# Patient Record
Sex: Female | Born: 1970 | Race: White | Hispanic: No | Smoking: Never smoker
Health system: Southern US, Community
[De-identification: ages and names within clinical notes are randomized; demographics above are authoritative.]

## PROBLEM LIST (undated history)

## (undated) DIAGNOSIS — R51 Headache: Secondary | ICD-10-CM

## (undated) DIAGNOSIS — E039 Hypothyroidism, unspecified: Secondary | ICD-10-CM

## (undated) DIAGNOSIS — R569 Unspecified convulsions: Secondary | ICD-10-CM

## (undated) HISTORY — PX: TUBAL LIGATION: SHX77

---

## 2000-02-23 ENCOUNTER — Other Ambulatory Visit: Admission: RE | Admit: 2000-02-23 | Discharge: 2000-02-23 | Payer: Self-pay | Admitting: Obstetrics and Gynecology

## 2001-03-16 ENCOUNTER — Other Ambulatory Visit: Admission: RE | Admit: 2001-03-16 | Discharge: 2001-03-16 | Payer: Self-pay | Admitting: Obstetrics and Gynecology

## 2002-04-01 ENCOUNTER — Inpatient Hospital Stay (HOSPITAL_COMMUNITY): Admission: AD | Admit: 2002-04-01 | Discharge: 2002-04-01 | Payer: Self-pay | Admitting: Obstetrics and Gynecology

## 2002-04-02 ENCOUNTER — Encounter (INDEPENDENT_AMBULATORY_CARE_PROVIDER_SITE_OTHER): Payer: Self-pay | Admitting: Specialist

## 2002-04-02 ENCOUNTER — Inpatient Hospital Stay (HOSPITAL_COMMUNITY): Admission: AD | Admit: 2002-04-02 | Discharge: 2002-04-05 | Payer: Self-pay | Admitting: *Deleted

## 2002-04-07 ENCOUNTER — Inpatient Hospital Stay (HOSPITAL_COMMUNITY): Admission: AD | Admit: 2002-04-07 | Discharge: 2002-04-07 | Payer: Self-pay | Admitting: Obstetrics and Gynecology

## 2002-05-02 ENCOUNTER — Other Ambulatory Visit: Admission: RE | Admit: 2002-05-02 | Discharge: 2002-05-02 | Payer: Self-pay | Admitting: Obstetrics and Gynecology

## 2003-03-31 ENCOUNTER — Encounter: Payer: Self-pay | Admitting: Emergency Medicine

## 2003-03-31 ENCOUNTER — Emergency Department (HOSPITAL_COMMUNITY): Admission: EM | Admit: 2003-03-31 | Discharge: 2003-03-31 | Payer: Self-pay | Admitting: *Deleted

## 2003-05-07 ENCOUNTER — Ambulatory Visit (HOSPITAL_COMMUNITY): Admission: RE | Admit: 2003-05-07 | Discharge: 2003-05-07 | Payer: Self-pay | Admitting: Neurology

## 2003-05-16 ENCOUNTER — Other Ambulatory Visit: Admission: RE | Admit: 2003-05-16 | Discharge: 2003-05-16 | Payer: Self-pay | Admitting: *Deleted

## 2004-11-08 ENCOUNTER — Ambulatory Visit (HOSPITAL_COMMUNITY): Admission: RE | Admit: 2004-11-08 | Discharge: 2004-11-08 | Payer: Self-pay | Admitting: Obstetrics and Gynecology

## 2005-07-04 ENCOUNTER — Inpatient Hospital Stay (HOSPITAL_COMMUNITY): Admission: AD | Admit: 2005-07-04 | Discharge: 2005-07-04 | Payer: Self-pay | Admitting: *Deleted

## 2005-07-05 ENCOUNTER — Inpatient Hospital Stay (HOSPITAL_COMMUNITY): Admission: AD | Admit: 2005-07-05 | Discharge: 2005-07-05 | Payer: Self-pay | Admitting: Obstetrics and Gynecology

## 2005-07-26 ENCOUNTER — Inpatient Hospital Stay (HOSPITAL_COMMUNITY): Admission: AD | Admit: 2005-07-26 | Discharge: 2005-07-29 | Payer: Self-pay | Admitting: Obstetrics & Gynecology

## 2005-07-26 ENCOUNTER — Ambulatory Visit: Payer: Self-pay | Admitting: Neonatology

## 2005-09-08 ENCOUNTER — Inpatient Hospital Stay (HOSPITAL_COMMUNITY): Admission: RE | Admit: 2005-09-08 | Discharge: 2005-09-08 | Payer: Self-pay | Admitting: *Deleted

## 2005-09-08 ENCOUNTER — Inpatient Hospital Stay (HOSPITAL_COMMUNITY): Admission: RE | Admit: 2005-09-08 | Discharge: 2005-09-10 | Payer: Self-pay | Admitting: Obstetrics and Gynecology

## 2006-01-16 ENCOUNTER — Encounter: Admission: RE | Admit: 2006-01-16 | Discharge: 2006-01-16 | Payer: Self-pay | Admitting: Orthopaedic Surgery

## 2006-07-05 ENCOUNTER — Emergency Department (HOSPITAL_COMMUNITY): Admission: EM | Admit: 2006-07-05 | Discharge: 2006-07-05 | Payer: Self-pay | Admitting: *Deleted

## 2006-07-08 ENCOUNTER — Emergency Department (HOSPITAL_COMMUNITY): Admission: EM | Admit: 2006-07-08 | Discharge: 2006-07-08 | Payer: Self-pay | Admitting: Emergency Medicine

## 2006-08-23 ENCOUNTER — Ambulatory Visit: Payer: Self-pay | Admitting: Internal Medicine

## 2006-09-05 ENCOUNTER — Ambulatory Visit: Payer: Self-pay | Admitting: Internal Medicine

## 2006-09-13 ENCOUNTER — Ambulatory Visit: Payer: Self-pay | Admitting: Internal Medicine

## 2006-09-13 LAB — CONVERTED CEMR LAB: Cortisol, Plasma: 10.4 ug/dL

## 2006-09-28 ENCOUNTER — Ambulatory Visit: Payer: Self-pay | Admitting: Internal Medicine

## 2007-01-01 ENCOUNTER — Ambulatory Visit: Payer: Self-pay | Admitting: Internal Medicine

## 2007-02-21 ENCOUNTER — Ambulatory Visit: Payer: Self-pay | Admitting: Internal Medicine

## 2007-03-14 ENCOUNTER — Ambulatory Visit: Payer: Self-pay | Admitting: Licensed Clinical Social Worker

## 2007-03-21 ENCOUNTER — Ambulatory Visit: Payer: Self-pay | Admitting: Licensed Clinical Social Worker

## 2007-03-30 ENCOUNTER — Ambulatory Visit: Payer: Self-pay | Admitting: Licensed Clinical Social Worker

## 2007-04-18 ENCOUNTER — Ambulatory Visit: Payer: Self-pay | Admitting: Licensed Clinical Social Worker

## 2007-05-02 ENCOUNTER — Ambulatory Visit: Payer: Self-pay | Admitting: Licensed Clinical Social Worker

## 2007-05-11 ENCOUNTER — Ambulatory Visit: Payer: Self-pay | Admitting: Licensed Clinical Social Worker

## 2007-05-23 ENCOUNTER — Ambulatory Visit: Payer: Self-pay | Admitting: Licensed Clinical Social Worker

## 2007-05-25 ENCOUNTER — Ambulatory Visit: Payer: Self-pay | Admitting: Internal Medicine

## 2007-05-30 ENCOUNTER — Ambulatory Visit: Payer: Self-pay | Admitting: Licensed Clinical Social Worker

## 2007-08-28 DIAGNOSIS — R51 Headache: Secondary | ICD-10-CM | POA: Insufficient documentation

## 2007-08-28 DIAGNOSIS — R519 Headache, unspecified: Secondary | ICD-10-CM | POA: Insufficient documentation

## 2007-09-04 ENCOUNTER — Ambulatory Visit: Payer: Self-pay | Admitting: Internal Medicine

## 2007-09-04 DIAGNOSIS — F4321 Adjustment disorder with depressed mood: Secondary | ICD-10-CM | POA: Insufficient documentation

## 2007-12-27 ENCOUNTER — Ambulatory Visit: Payer: Self-pay | Admitting: Internal Medicine

## 2007-12-27 DIAGNOSIS — Z8669 Personal history of other diseases of the nervous system and sense organs: Secondary | ICD-10-CM | POA: Insufficient documentation

## 2007-12-27 DIAGNOSIS — J3089 Other allergic rhinitis: Secondary | ICD-10-CM | POA: Insufficient documentation

## 2008-01-21 ENCOUNTER — Telehealth: Payer: Self-pay | Admitting: Internal Medicine

## 2008-03-03 ENCOUNTER — Encounter: Admission: RE | Admit: 2008-03-03 | Discharge: 2008-03-03 | Payer: Self-pay | Admitting: Otolaryngology

## 2008-04-16 ENCOUNTER — Inpatient Hospital Stay (HOSPITAL_COMMUNITY): Admission: AD | Admit: 2008-04-16 | Discharge: 2008-04-17 | Payer: Self-pay | Admitting: Obstetrics and Gynecology

## 2008-04-16 ENCOUNTER — Inpatient Hospital Stay (HOSPITAL_COMMUNITY): Admission: AD | Admit: 2008-04-16 | Discharge: 2008-04-16 | Payer: Self-pay | Admitting: *Deleted

## 2008-04-17 ENCOUNTER — Inpatient Hospital Stay (HOSPITAL_COMMUNITY): Admission: AD | Admit: 2008-04-17 | Discharge: 2008-04-17 | Payer: Self-pay | Admitting: Obstetrics and Gynecology

## 2008-06-04 ENCOUNTER — Inpatient Hospital Stay (HOSPITAL_COMMUNITY): Admission: RE | Admit: 2008-06-04 | Discharge: 2008-06-07 | Payer: Self-pay | Admitting: *Deleted

## 2009-06-10 ENCOUNTER — Telehealth: Payer: Self-pay | Admitting: Internal Medicine

## 2009-06-10 DIAGNOSIS — M542 Cervicalgia: Secondary | ICD-10-CM | POA: Insufficient documentation

## 2009-06-11 ENCOUNTER — Ambulatory Visit: Payer: Self-pay | Admitting: Family Medicine

## 2009-06-11 DIAGNOSIS — G43909 Migraine, unspecified, not intractable, without status migrainosus: Secondary | ICD-10-CM | POA: Insufficient documentation

## 2009-06-15 ENCOUNTER — Telehealth: Payer: Self-pay | Admitting: Internal Medicine

## 2009-06-15 ENCOUNTER — Encounter: Admission: RE | Admit: 2009-06-15 | Discharge: 2009-06-15 | Payer: Self-pay | Admitting: Internal Medicine

## 2009-06-17 ENCOUNTER — Telehealth: Payer: Self-pay | Admitting: Internal Medicine

## 2009-06-19 ENCOUNTER — Telehealth: Payer: Self-pay | Admitting: Internal Medicine

## 2009-08-19 ENCOUNTER — Telehealth: Payer: Self-pay | Admitting: Internal Medicine

## 2009-12-01 DIAGNOSIS — G4700 Insomnia, unspecified: Secondary | ICD-10-CM | POA: Insufficient documentation

## 2009-12-02 ENCOUNTER — Encounter: Payer: Self-pay | Admitting: Internal Medicine

## 2009-12-10 ENCOUNTER — Telehealth: Payer: Self-pay | Admitting: Internal Medicine

## 2009-12-10 DIAGNOSIS — E039 Hypothyroidism, unspecified: Secondary | ICD-10-CM | POA: Insufficient documentation

## 2010-01-25 ENCOUNTER — Telehealth: Payer: Self-pay | Admitting: Internal Medicine

## 2010-03-22 ENCOUNTER — Ambulatory Visit: Payer: Self-pay | Admitting: Family Medicine

## 2010-03-22 DIAGNOSIS — K5289 Other specified noninfective gastroenteritis and colitis: Secondary | ICD-10-CM | POA: Insufficient documentation

## 2010-09-06 ENCOUNTER — Encounter: Payer: Self-pay | Admitting: Internal Medicine

## 2010-12-28 NOTE — Assessment & Plan Note (Signed)
Summary: ? food poisoning/vomitting/diarrhea/cjr   Vital Signs:  Patient profile:   40 year old female Menstrual status:  regular Weight:      114 pounds Temp:     99.0 degrees F oral BP sitting:   88 / 60  (left arm) Cuff size:   regular  Vitals Entered By: Raechel Ache, RN (March 22, 2010 3:14 PM) CC: On cruise last week- c/o vomiting Thurs & Fri, diarrhea over weekend. Still feels weak, no appetite.   History of Present Illness: Here for 6 days of mild abdominal cramps, diarrhea, and nausea. She vomitted once. No fever. This started when she got back from a [place] cruise with her family last week. her daughter has similar symptoms, husband and son are okay. using Imodium and eating bland foods.   Allergies: 1)  ! Erythromycin 2)  ! Augmentin  Past History:  Past Medical History: Reviewed history from 08/28/2007 and no changes required. Allergies Seizure disorder Migraines Scarlet Fever Headache  Review of Systems  The patient denies anorexia, fever, weight loss, weight gain, vision loss, decreased hearing, hoarseness, chest pain, syncope, dyspnea on exertion, peripheral edema, prolonged cough, headaches, hemoptysis, melena, hematochezia, severe indigestion/heartburn, hematuria, incontinence, genital sores, muscle weakness, suspicious skin lesions, transient blindness, difficulty walking, depression, unusual weight change, abnormal bleeding, enlarged lymph nodes, angioedema, breast masses, and testicular masses.    Physical Exam  General:  Well-developed,well-nourished,in no acute distress; alert,appropriate and cooperative throughout examination Lungs:  Normal respiratory effort, chest expands symmetrically. Lungs are clear to auscultation, no crackles or wheezes. Heart:  Normal rate and regular rhythm. S1 and S2 normal without gallop, murmur, click, rub or other extra sounds. Abdomen:  Bowel sounds positive,abdomen soft and non-tender without masses, organomegaly  or hernias noted.   Impression & Recommendations:  Problem # 1:  GASTROENTERITIS (ICD-558.9)  Complete Medication List: 1)  Midrin 325-65-100 Mg Caps (Apap-isometheptene-dichloral) .... Two by mouth q 6 hours as needed ha 2)  Centrum Chew (Multiple vitamins-minerals) .... Two qd 3)  Calcium 500/d 500-400 Mg-unit Chew (Calcium-vitamin d) .... Two once daily 4)  Ra Glucosamine-chondroit-msm 500-400-300 Mg Tabs (Glucosamine-chondroitin-msm) .... Once daily 5)  Topamax 200 Mg Tabs (Topiramate) .Marland Kitchen.. 1 once daily 6)  Prednisone 10 Mg Tabs (Prednisone) .... Taper as follows:  4-4-4-3-3-2-2-1 7)  Hydrocodone-acetaminophen 5-325 Mg Tabs (Hydrocodone-acetaminophen) .... One to two by mouth q 4-6 hours as needed 8)  Lunesta 3 Mg Tabs (Eszopiclone) .Marland Kitchen.. 1 at bedtime as needed sleep 9)  Esgic-plus 50-500-40 Mg Tabs (Butalbital-apap-caffeine) .... One by mouth q 6 hours as needed headache 10)  Levothyroxine Sodium 25 Mcg Tabs (Levothyroxine sodium) .Marland Kitchen.. 1 once daily 11)  Dhea 10 Mg Caps (Prasterone (dhea)) .Marland Kitchen.. 1 once daily 12)  Cipro 500 Mg Tabs (Ciprofloxacin hcl) .... Two times a day  Patient Instructions: 1)  Try Cipro.  2)  Please schedule a follow-up appointment as needed .  Prescriptions: CIPRO 500 MG TABS (CIPROFLOXACIN HCL) two times a day  #14 x 0   Entered and Authorized by:   Nelwyn Salisbury MD   Signed by:   Nelwyn Salisbury MD on 03/22/2010   Method used:   Electronically to        [facility]  [phone]* (retail)       [address]       [place], [place]  [address]       Ph: [phone] or [phone]  Fax: [phone]   RxID:   [id number]

## 2010-12-28 NOTE — Letter (Signed)
[facility]   Imported By: Maryln Gottron 09/14/2010 10:33:00  _____________________________________________________________________  External Attachment:    Type:   Image     Comment:   External Document

## 2010-12-28 NOTE — Progress Notes (Signed)
Summary: Endocrinology referral  Phone Note Call from Patient   Caller: Patient Call For: Stacie Glaze MD Reason for Call: Acute Illness Summary of Call: [phone] Pt has been seeing an OB of hormones and thyroid issues and wants a referral from Dr. Lovell Sheehan to a female Endocrinologist if he knows one in the area.  Initial call taken by: Lynann Beaver CMA,  December 10, 2009 1:28 PM  Follow-up for Phone Call        there is dr balan, if she is currently taking any new pt. we would have to make a referral.  Would she like [place] to that? Follow-up by: Willy Eddy, LPN,  December 11, 2009 8:36 AM  New Problems: HYPOTHYROIDISM (ICD-244.9)   New Problems: HYPOTHYROIDISM (ICD-244.9)

## 2010-12-28 NOTE — Medication Information (Signed)
Summary: Approval for Hyponotics/[employer]  Approval for Hyponotics/[employer]   Imported By: Maryln Gottron 12/07/2009 14:35:00  _____________________________________________________________________  External Attachment:    Type:   Image     Comment:   External Document

## 2010-12-28 NOTE — Progress Notes (Signed)
Summary: No Midrin .........in pain.  Phone Note Call from Patient   Caller: Patient Call For: Stacie Glaze MD Summary of Call: Laurie Oneal ([place]) No Midrin has been available for weeks and weeks, and is having a migraine.  Needs substitute for pain, ASAP! [phone] Initial call taken by: Lynann Beaver CMA,  January 25, 2010 8:54 AM  Follow-up for Phone Call        can send in esgic plus and need OV with 30 days  ( number 30 one by mouth q 6 hours Follow-up by: Stacie Glaze MD,  January 25, 2010 8:56 AM    New/Updated Medications: ESGIC-PLUS 50-500-40 MG TABS (BUTALBITAL-APAP-CAFFEINE) one by mouth q 6 hours as needed headache Prescriptions: ESGIC-PLUS 50-500-40 MG TABS (BUTALBITAL-APAP-CAFFEINE) one by mouth q 6 hours as needed headache  #30 x 0   Entered by:   Lynann Beaver CMA   Authorized by:   Stacie Glaze MD   Signed by:   Lynann Beaver CMA on 01/25/2010   Method used:   Electronically to        [facility]  [phone]* (retail)       [address]       [place], [place]  [address]       Ph: [phone] or [phone]       Fax: [phone]   RxID:   [id number]  Pt. notified.

## 2011-04-12 NOTE — Op Note (Signed)
Laurie Oneal, BEAVERS NO.:  [id number]   MEDICAL RECORD NO.:  [id number]          PATIENT TYPE:  INP   LOCATION:  9104                          [facility]ILITY[facility]   PHYSICIAN:  Gerri Spore B. Earlene Plater, M.D.  DATE OF BIRTH:  03-30-1971   DATE OF PROCEDURE:  06/04/2008  DATE OF DISCHARGE:                               OPERATIVE REPORT   PREOPERATIVE DIAGNOSIS:  Previous C-section x2 and desires tubal  sterilization.   POSTOPERATIVE DIAGNOSIS:  Previous C-section x2 and desires tubal  sterilization.   PROCEDURES:  Repeat C-section and bilateral tubal ligation.   SURGEON:  Chester Holstein. Earlene Plater, MD   ASSISTANT:  Maxie Better, MD   ANESTHESIA:  Spinal.   FINDINGS:  Viable female of 7 pounds 10 ounces, Apgars 8 and 9, normal-  appearing uterus, tubes, and ovaries.  Moderate adherence of the bladder  to the lower uterine segment.   INDICATIONS:  The patient with above history for repeat section and  permanent tubal sterilization.  Surgical risks discussed as well as  failure rate and ectopic risk of tubal ligation.   PROCEDURE:  The patient was taken to the operating room and spinal  anesthesia was obtained.  The patient was prepped and draped in a  standard fashion.  Foley catheter was inserted to the bladder.  Transverse incision made through the previous C-section scar.  Fascia  was divided sharply and dissected laterally bluntly.  Posterior rectus  muscles were dissected about 1-cm off anteriorly.  This also lead to  entry into the peritoneal cavity sharply.  The entry was then extended  bluntly.  Bladder blade inserted.  Uterine incision made in a low  transverse fashion with a knife and extended laterally bluntly.  Watery  meconium at amniotomy.  Vertex was elevated to the incision.  Nose and  mouth suctioned with a DeLee.  Remainder of the infant was delivered  without difficulty.  Cord clamped and cut.  Infant was handed off to  waiting pediatricians.  Ancef 1 gram  had been given prior to skin  incision.   Placenta was removed by uterine massage.  Uterus was exteriorized and  cleared of all clots and debris.  It was noted that the Foley was  draining blood-tinged urine initially, which cleared essentially  immediately.  Uterine incision was closed in a running lock stitch of 0  chromic with hemostasis obtained.  The bladder was then back-filled with  300 mL of sterile milk.  It was watertight and no evidence for bladder  injury.  The urine was subsequently clear.   The left tube was identified and followed through its fimbriated end.  Filshie clip placed 2 cm distal to the cornu, perpendicular across the  tube, completely across the tube.  Repeated on the right side in the  same manner.   Uterus returned to the abdomen.  Pelvis irrigated and the lines of  dissection were inspected and found to be hemostatic.   Fascia was closed with a running stitch of 0 Vicryl.  Subcutaneous  tissue was hemostatic.  Skin was closed with staples.   The patient tolerated  the procedure well without complications.  She was  taken to the recovery room and awakened in stable condition.  All counts  correct per the operating staff.      Gerri Spore B. Earlene Plater, M.D.  Electronically Signed     WBD/MEDQ  D:  06/04/2008  T:  06/05/2008  Job:  [id number]

## 2011-04-15 NOTE — Discharge Summary (Signed)
Laurie Oneal, Laurie Oneal NO.:  [id number]   MEDICAL RECORD NO.:  [id number]          PATIENT TYPE:  INP   LOCATION:  9158                          [facility]   PHYSICIAN:  Richardean Sale, M.D.   DATE OF BIRTH:  01/08/1971   DATE OF ADMISSION:  07/26/2005  DATE OF DISCHARGE:  07/29/2005                                 DISCHARGE SUMMARY   ADMITTING DIAGNOSIS:  A 31+ week intrauterine pregnancy with preterm labor.   DISCHARGE DIAGNOSIS:  A 31+ week intrauterine pregnancy with preterm labor.   PROCEDURES:  None.   HISTORY OF PRESENT ILLNESS:  Please see admitting history and physical for  details.  Briefly, this is a 41 year old white female, gravida 2, para 1,  with a known positive fetal fibronectin, with advanced cervical dilation who  was seen in the office for followup on July 26, 2005, complaining of  increasing frequency of contractions despite oral Procardia.  The patient  was subsequently admitted, was found to be contracting.  The cervix, on  admission, was 4-cm, 50%, -1 station.  The patient continued to contract  despite Procardia; therefore, she was converted to a IV magnesium sulfate  which was continued till hospital day #2, when the patient's contractions  decreased in frequency.  The patient's cervical exam was unchanged  throughout her hospitalization, and she was discharged to home on hospital  day #3 in stable condition.  Fetal heart rate tracing was reactive  throughout her admission.   ADMISSION LABORATORY:  Showed a white count of 10.8, hematocrit of 33.2,  hemoglobin 11.3, platelet count of 150.  Urinalysis was negative for any  infection.  Ultrasound obtained showed an appropriately growing fetus at the  75th to 90th percentile for her gestational age with a normal amniotic fluid  index and no obvious anatomic abnormalities in a cephalic presentation.   DISPOSITION:  To home.   CONDITION:  Stable.   INSTRUCTIONS:  1.  The patient  is to remain on home bed rest and continue her Procardia 10      mg p.o. q.6h.  2.  She is to return to the office in one week for a followup visit.  3.  In addition, she is to continue her daily prenatal vitamins, Zoloft 50      mg daily, and Zantac as needed for heartburn.  4.  She is to call for any increase in frequency of her contractions, loss      of fluid, vaginal bleeding, or decreased fetal movement.     Richardean Sale, M.D.  Electronically Signed    JW/MEDQ  D:  09/03/2005  T:  09/03/2005  Job:  [id number]

## 2011-04-15 NOTE — Consult Note (Signed)
NAME:  Laurie Oneal, Laurie Oneal NO.:  [id number]   MEDICAL RECORD NO.:  [id number]                   PATIENT TYPE:  EMS   LOCATION:  MINO                                 FACILITY:  [facility]   PHYSICIAN:  Marlan Palau, M.D.               DATE OF BIRTH:  1970/12/24   DATE OF CONSULTATION:  DATE OF DISCHARGE:                                   CONSULTATION   HISTORY OF PRESENT ILLNESS:  The patient is a 40 year old right-handed  female born 08/08/1971, with a history of migraine headache in the past since  around age 43.  The patient's headaches usually occur associated with the  menstrual cycle, beginning mid cycle.  The patient has recently delivered a  child and has gone back on birth control pills over the last couple of  months.  The patient in the past has noted headaches associated with nausea,  occasional vomiting, occasional spots in front of the eyes, photophobia, and  tends to be in the back of the head.  Today, the patient noted onset of an  event associated with visual disturbance predominantly off to the right wiht  a positive visual phenomena wiht white vision and, as this cleared, the  patient began noting problems with speech, was able to understand what  people were saying but could not generate language effectively.  As the  language cleared, the patient began to have some numb, tingling sensations  in the right hand, forearm, marching up to the face on the right and then  around the mouth.  This has resolved mostly.  As the focal symptoms  resolved, headache came on with posterior pain around the eyes, some nausea,  no vomiting.  The patient denied any  problems with loss of consciousness or  ability to ambulate.  The patient did note some dizziness.  Headache is  improving with analgesics.  The patient normally takes Imitrex for her  headaches, did not take Imitrex today.  CT of the head done through the  emergency room was unremarkable.  This  was done without contrast.   PAST MEDICAL HISTORY:  1. History of migraine headache.  2. History of wisdom tooth resection.  3. LASIK surgery in the past.  4. History of C section in the past.   MEDICATIONS:  The patient is on birth control pills and eye drops for the  eyes.   ALLERGIES:  ERYTHROMYCIN.   HABITS:  The patient does not smoke, drinks alcohol on occasion,denies use  of drugs such as cocaine, marijuana.   SOCIAL HISTORY:  The patient lives in the [place] area, is married, has  one child, lives with her husband.  She works as a International aid/development worker.   FAMILY HISTORY:  Notable that mother is alive with history of  hypothyroidism.  Father is alive with history of hypertension, obesity,  sleep apnea.  The patient has two brothers, both with migraine; mother  also  has migraine.   REVIEW OF SYSTEMS:  Notable for no fevers or chills.  The patient denies any  visual disturbance other than what occurred tonight.  Denies any shortness  of breath, chest pain.  Does note some neck stiffness at times.  Denies any  abdominal pain, does note some nausea.  Denies any problems controlling the  bowels or bladder. Denies blackout spells.   PHYSICAL EXAMINATION:  VITAL SIGNS:  Blood pressure 119/68, heart rate 71,  respiratory rate 22, temperature afebrile.  GENERAL:  This patient is a well-developed white female who is alert and  cooperative at time of examination.  HEENT:  Head is atraumatic.  Eyes: Pupils are equal, round, and reactive to  light.  Disks are flat bilaterally.  NECK:  Supple.  No carotid bruits noted.  RESPIRATORY:  Clear.  CARDIOVASCULAR:  Regular rate and rhythm.  No obvious murmurs or rubs noted.  EXTREMITIES:  Without significant edema.  NEUROLOGIC:  Cranial nerves as above. Facial symmetry is present.  The  patient has good sensation of the face to pinprick and soft touch  bilaterally.  She has good strength to facial muscles and muscles to head  turning and  shoulder shrug bilaterally.  Speech is well enunciated and not  aphasic.  Visual fields are full to double simultaneous stimulation.  Motor  testing reveals 5/5 strength in all fours.  Good symmetric motor tone is  noted throughout.  Sensory testing is intact to pinprick, soft touch,  vibratory sensation throughout with the exception of some mild decrease in  vibratory sensation on the right hand compared to the left.  The patient has  good finger-nose-finger and toe-to-finger bilaterally.  Gait was not tested.  Deep tendon reflexes are symmetric and normal.  Toes are downgoing  bilaterally.   LABORATORY DATA:  CT of the head is normal as above.   Laboratory values notable for sodium of 139, potassium 3.4, chloride 103,  CO2 20, glucose 110, pH 7.339, PCO2 56.2, bicarb 32. White count 9.2,  hemoglobin 16.1, hematocrit 47.3, MCV 87.0, platelets 341.  Urinalysis  reveals specific gravity of 1.006, pH 6.5, otherwise unremarkable.  INR 1.0.  Creatinine 1.0.  Urine pregnancy test negative.   IMPRESSION:  1. History of migraines.  2. Migraine headache today with neurologic features.   This patient has a typical history and physical examination consistent with  migraine.  Will have patient take aspirin beginning tonight, continue  aspirin 81 mg daily.  Will follow up as an outpatient with MRI scan of the  brain and MR angiogram.  The patient will follow up in our office in three  or four weeks.   DIAGNOSIS:  Neurologic migraine.                                               Marlan Palau, M.D.    CKW/MEDQ  D:  03/31/2003  T:  04/01/2003  Job:  [id number]   cc:   [facility]  [address], [address]

## 2011-04-15 NOTE — Discharge Summary (Signed)
[facility]  Patient:    Laurie Oneal, Laurie Oneal Visit Number: [id number] MRN: [id number]          Service Type: MED Location: [facility] Attending Physician:  Soledad Gerlach Dictated by:   Julio Sicks, N.P. Admit Date:  04/07/2002 Discharge Date: 04/07/2002                             Discharge Summary  ADMITTING DIAGNOSES:          1. Intrauterine pregnancy at 40-6/7 weeks.                               2. Spontaneous rupture of membranes with thick                                  meconium fluid.                               3. Positive group B beta Strep.  DISCHARGE DIAGNOSES:          1. Status post low transverse cesarean delivery.                               2. Chorioamnionitis, resolved.                               3. Viable female infant.  PROCEDURE:                    Primary low transverse cesarean section.  REASON FOR ADMISSION:         Please see written H&P.  HOSPITAL COURSE:              The patient is a 40 year old, gravida 1, para 0, at 40-6/7 weeks who presented with spontaneous rupture of membranes with thick meconium fluid noted on admission.  Pregnancy was also complicated by group B beta Strep.  She underwent an amnioinfusion for meconium and was placed on IV penicillin for group B beta Strep.  Pitocin was given for augmentation of labor.  Progression was made to complete dilatation.  During the second stage of labor, fetal bradycardia occurred which responded to IV terbutaline and IV fluids.  Cervix had become edematous with pushing.  Epidural anesthesia was administered.  The patient was able to rest for 3-4 hours.  Fetal heart tones were reassuring in the 150s.  Cervix returned to full dilatation, and pushing was restarted.  Deep, variable decelerations were noted with loss of variability with pushing.  Decision was made to proceed with a cesarean delivery for fetal distress.  The patient was taken to the operating  room for the above-named procedure.  A low transverse incision was made.  A viable female infant was delivered, weighing 7 pounds 9 ounces with Apgars of 9 at one minute and 9 at five minutes.  Cord pH was 7.28.  The patient tolerated the procedure well and was taken to the recovery room.  The patient did develop a postpartum atony in the recovery room which responded to Methergine and vigorous massage.  The patient did develop an elevation in temperature to 101 degrees F.  IV antibiotics were started.  On postoperative day one, abdomen was noted to be distended.  Bowel sounds were sluggish.  The patient was tolerating a clear liquid diet without complaints of nausea or vomiting. The patient was afebrile.  IV antibiotics continued.  Foley catheter was discontinued, and the patient was voiding without difficulty.  Labs revealed a hemoglobin of 10.5, hematocrit 29.8, and WBC count 11.6, platelet count 122,000.  On postoperative day two, abdomen continued to be distended.  The patient was afebrile.  Antibiotics were discontinued.  Incision was clean, dry, and intact.  The patient continued on a clear liquid diet without complaints of nausea or vomiting.  The baby was transferred to the NICU.  On postoperative day three, abdomen was soft.  The patient had good return of bowel function.  Incision was clean, dry, and intact.  Staples were removed. The patient was ambulating without assistance.  The patient was discharged home.  CONDITION ON DISCHARGE:       Stable.  DIET:                         Regular as tolerated.  ACTIVITY:                     1. No heavy lifting.                               2. No driving x 2 weeks.                               3. No vaginal entry.  FOLLOW-UP:                    The patient is to follow up in the office in 1-2 weeks for an incision check.  She is to call for a temperature greater than 100 degrees, persistent nausea and vomiting, heavy vaginal  bleeding, and/or redness or drainage of the incision site.  DISCHARGE MEDICATIONS:        1. Demerol 50 #30, 1 p.o. q.4-6h. p.r.n. pain.                               2. Ibuprofen 600 mg over-the-counter q.6h.                               3. Prenatal vitamins q.d. Dictated by:   Julio Sicks, N.P. Attending Physician:  Soledad Gerlach DD:  04/19/02 TD:  04/23/02 Job: [id number]

## 2011-04-15 NOTE — Discharge Summary (Signed)
NAMEILIANI, VEJAR NO.:  [id number]   MEDICAL RECORD NO.:  [id number]          PATIENT TYPE:  INP   LOCATION:  9124                          [facility]   PHYSICIAN:  Gerri Spore B. Earlene Plater, M.D.  DATE OF BIRTH:  1971/04/06   DATE OF ADMISSION:  09/08/2005  DATE OF DISCHARGE:  09/10/2005                                 DISCHARGE SUMMARY   ADMISSION DIAGNOSES:  1.  Previous cesarean section, 38+ weeks.  2.  Nausea and vomiting.  3.  Mature fetal lung studies by amniocentesis.   DISCHARGE DIAGNOSES:  1.  Repeat cesarean section, 38+ weeks.  2.  Nausea and vomiting.  3.  Mature fetal lung studies by amniocentesis.   PROCEDURE:  Repeat low transverse cesarean section.   HISTORY OF PRESENT ILLNESS:  This 40 year old white female gravida 2, para  1, 38+ weeks with persistent and worsening nausea, vomiting with inability  to tolerate oral intake for several days. Amniocentesis performed the day  prior to surgery showed mature fetal lung studies.   HOSPITAL COURSE:  The patient was admitted and underwent repeat low  transverse cesarean section. Findings include a viable female, Apgar's 8 and  8, weight was 7 pounds, 0 ounces. Normal appearing uterus, tubes and  ovaries.   Postoperatively the patient rapidly regained her ability to ambulate and  void, and tolerate a regular diet. She was discharged home on the third  postoperative day in satisfactory condition.   DISCHARGE INSTRUCTIONS:  See pre-printed instructions.   DISCHARGE MEDICATIONS:  1.  Darvocet N 100 1-2 q.4-6h p.r.n. for pain.  2.  Toradol 10 mg q.6h for pain.   FOLLOW UP:  [facility] with Dr. Earlene Plater in 4 weeks.   CONDITION ON DISCHARGE:  Satisfactory.      Gerri Spore B. Earlene Plater, M.D.  Electronically Signed    WBD/MEDQ  D:  10/08/2005  T:  10/08/2005  Job:  [id number]

## 2011-04-15 NOTE — H&P (Signed)
Laurie Oneal, HELBLING NO.:  [id number]   MEDICAL RECORD NO.:  [id number]          PATIENT TYPE:  MAT   LOCATION:  MATC                          FACILITY:  [facility]   PHYSICIAN:  Richardean Sale, M.D.   DATE OF BIRTH:  January 22, 1971   DATE OF ADMISSION:  07/26/2005  DATE OF DISCHARGE:                                HISTORY & PHYSICAL   ADMITTING DIAGNOSES:  A 40+ week intrauterine pregnancy with preterm labor.   HISTORY OF PRESENT ILLNESS:  This is a 40 year old gravida 2, para 1-0-0-1  white female who is at 31+ weeks gestation with a due date of September 25, 2005 by ultrasound who presented to the office today for a follow-up visit.  Pregnancy has been complicated by advanced cervical dilation and a positive  fetal fibronectin.  She was 3 cm, 50%, -1 station last week.  Patient  complained this morning of increasing pelvic pressure and irregular  tightening of the uterus.  She denies any loss of fluid or vaginal bleeding,  reports good movement.  Prenatal care has been at [facility] with Dr.  Marina Gravel at the primary physician.  Patient did receive betamethasone x2  doses due to advanced cervical dilation.  She has been on home bed rest up  until this point.   PAST OBSTETRICAL HISTORY:  Gravida 1, cesarean section due to nonreassuring  fetal heart rate tracing.  Subsequently had a postpartum hemorrhage and  postpartum depression.  The infant had E. coli sepsis shortly after birth,  but is alive and well.  She was group B beta Strep positive with the first  pregnancy.  Gravida 2 current pregnancy.   PAST GYNECOLOGICAL HISTORY:  Positive history of abnormal Pap smear.  No  treatment needed.   PAST MEDICAL HISTORY:  Migraines.  No other hospitalizations.   PAST SURGICAL HISTORY:  Cesarean section in 2003.   FAMILY HISTORY:  Patient's brother has cerebral palsy, otherwise no known  birth defects, congenital anomalies, spina bifida, cystic fibrosis, or Down  syndrome.  Father has emphysema.  Mother has Grave's disease and epilepsy.   SOCIAL HISTORY:  She is a International aid/development worker.  Is married.  Denies tobacco,  alcohol, or drugs.   MEDICATIONS:  Prenatal vitamins and Zoloft 50 mg p.o. daily.   ALLERGIES:  AUGMENTIN causes hives.  Patient was told never to take  PENICILLINS.   OBJECTIVE:  VITAL SIGNS:  She is afebrile.  Vital signs are stable.  GENERAL:  She is a well-developed, well-nourished white female who is in no  acute distress.  HEART:  Regular rate and rhythm.  LUNGS:  Clear to auscultation bilaterally.  ABDOMEN:  Gravid, soft, nontender.  No palpable masses.  Fundal height  appears AGA.  EXTREMITIES:  No clubbing, cyanosis, edema.  PELVIC:  Cervix is 4 cm, 50%, -1 station, vertex.  Fetal heart rate tracing  is reactive.  Monitor shows contractions initially every two to three  minutes.  Patient received terbutaline x1 with improvement in contractions  and is now having contractions approximately every 20 minutes.   LABORATORIES:  Blood type is O+.  Antibody  screen negative.  RPR  nonreactive.  Rubella immune.  Hepatitis B surface antigen nonreactive.  HIV  nonreactive.  Toxoplasmosis titer is negative.  Pap smear within normal  limits.  Gonorrhea and Chlamydia screens negative.  Triple screen within  normal limits.  Group B beta Strep negative.  Fetal fibronectin positive on  July 05, 2005.   ASSESSMENT:  A 40 year old gravida 2, para 1-0-0-1 white female at 31+ weeks  gestation with preterm contractions, cervical change.   PLAN:  1.  Will admit to antenatal.  2.  Bed rest with bathroom privileges.  3.  Will start Procardia 10 mg p.o. q.8h.  If patient continues to contract      despite Procardia will convert to magnesium sulfate.  4.  Patient is already status post betamethasone x2 doses.  5.  Will obtain complete OB ultrasound to evaluate for fetal weight and      fluid.  6.  NICU consult.            ______________________________  Richardean Sale, M.D.     JW/MEDQ  D:  07/26/2005  T:  07/26/2005  Job:  [id number]

## 2011-04-15 NOTE — Op Note (Signed)
[facility]  Patient:    Laurie Oneal, Laurie Oneal Visit Number: [id number] MRN: [id number]          Service Type: OBS Location: [facility] Attending Physician:  Donne Hazel Dictated by:   Trevor Iha, M.D. Proc. Date: 04/02/02 Admit Date:  04/01/2002 Discharge Date: 04/01/2002                             Operative Report  PREOPERATIVE DIAGNOSES:       1. Intrauterine pregnancy at 40-6/7th weeks.                               2. Meconium fetal distress.                               3. Chorioamnionitis.  POSTOPERATIVE DIAGNOSES:      1. Intrauterine pregnancy at 40-6/7th weeks.                               2. Meconium fetal distress.                               3. Chorioamnionitis.  OPERATION:                    Primary low segment transverse cesarean section.  SURGEON:                      Trevor Iha, M.D.  ANESTHESIA:                   Spinal.  INDICATIONS:                  The patient is a 40 year old G1, P0, at 40-6/7th weeks who presented with spontaneous rupture of membranes with thick meconium noted on admission.  Pregnancy was also complicated by group B strep positive. She underwent amnioinfusion for meconium and also was placed on IV penicillin for group B strep.  The patient recently started with hyperstimulation of the uterus with fetal bradycardia which responded to terbutaline subcu. Progressed throughout the morning to complete, but because of involuntary pushing, and again hyperstimulation, had fetal bradycardia and deep variable decelerations followed by fetal bradycardia of approximately 10-12 minutes which responded finally to IV terbutaline and IV fluids.  At this time, because of cervical swelling, the patient now was _________ left occiput anterior position and at zero station, and because of the involuntary pushing and edema around the rim, and also fetal intolerance to the involuntary pushing, it is  recommended epidural.  The patient agreed and received an epidural, after which fetal heart rate returns to normal at the 150s with good accelerations.  She was allowed to rest for three to four hours, at which time, she eventually became complete, at which time, when she began pushing, deep variables were noted which slowly recovered, and after approximately 20-30 minutes of pushing with the fetal vertex at +1 station, began having deep variables with very late recovery and started to lose variability and also began to have fetal tachycardia in the 170s to 180s.  Because it was felt that __________ and the baby was begin to lose beat-to-beats consistent with fetal  distress.  We were unable to allow the patient to continue to push since it appeared that she had a specific amount of time left to push before delivery and proceeded with cesarean section for fetal distress and intolerance to labor.  Also, at that time, because of fetal tachycardia, it was felt that the patient was beginning to develop chorioamnionitis despite a temperature of only 99.  The risks and benefits of the procedure were discussed and informed consent was obtained.  FINDINGS:                     At the time of surgery were a viable female infant.  Apgars were 9 and 9.  PH arterial was 7.28.  DESCRIPTION OF PROCEDURE:     After adequate analgesia, the patient was placed in the supine position with a left lateral tilt.  She was sterilely prepped and draped.  A Foley catheter was sterilely placed.  A Pfannenstiel skin incision was made two fingerbreadths above the pubic symphysis.  It was taken down sharply and the fascia was incised transversely, extended superiorly and inferiorly after the bellies of the rectus muscle were separated sharply in the midline.  The peritoneum was entered sharply.  A bladder blade was placed. The uterine serosa was elevated and incised transversely.  A bladder flap was created and placed  behind the bladder blade.  A low __________ incision was made down to the infant and extended laterally with the operators fingertips. The infants vertex was delivered atraumatically.  The nares and pharynx were then suctioned.  The infant was then delivered, the cord clamped, cut, and the infant handed to the neonatal team for resuscitation.  Cord blood was then obtained.  The placenta was extracted manually.  The uterus was exteriorized and wiped clean with a dry lap.  The myotomy incision was closed in three layers, the first being a running locking layer of 0 Monocryl, the second being an imbricating layer, and the third being an imbricating layer.  After adequate hemostasis was assured, the uterus was placed back into the peritoneal cavity.  After a copious amount of irrigation and adequate hemostasis, the peritoneum was closed with 0 Monocryl in a running fashion. The rectus muscle was plicated in the midline.  Irrigation was applied and after adequate hemostasis, the fascia was closed with #1 Vicryl in a running fashion.  Irrigation was again applied and after adequate hemostasis, the skin was stapled and Steri-Strips applied.  The patient tolerated the procedure well and was stable on transfer to the recovery room.  Sponge, needle, and instrument count was normal x3.  Estimated blood loss was 800 cc.  The patient did have some postpartum atony in the recovery room which responded to Methergine and vigorous massage.  The patient also developed a fever of 101 in the recovery room and was placed on cefotetan 1 g every 12 hours for chorioamnionitis. Dictated by:   Trevor Iha, M.D. Attending Physician:  Donne Hazel DD:  04/02/02 TD:  04/04/02 Job: [id number]

## 2011-04-15 NOTE — Discharge Summary (Signed)
NAMEALEXANDR, OEHLER NO.:  [id number]   MEDICAL RECORD NO.:  [id number]          PATIENT TYPE:  INP   LOCATION:  9104                          FACILITY:  [facility]   PHYSICIAN:  Lenoard Aden, M.D.DATE OF BIRTH:  [birth year]   DATE OF ADMISSION:  06/04/2008  DATE OF DISCHARGE:  06/07/2008                               DISCHARGE SUMMARY   ADMISSION DIAGNOSES:  1. A 39-week intrauterine pregnancy.  2. Previous C-section x2.  3. Undesired fertility.   PROCEDURES:  1. Repeat low transverse C-section.  2. Bilateral tubal ligation.   HISTORY OF PRESENT ILLNESS:  A 40 year old white female gravida 3, par  2, 39 weeks, history of C-section x2, presents for repeat and tubal  sterilization.   HOSPITAL COURSE:  The patient was admitted and underwent per repeat low  transverse C-section and bilateral tubal ligation with Filshie clips,  viable female 7 pounds 1 ounce, Apgars 8 and 9.  Normal-appearing uterus,  tubes, and ovaries.   Postoperatively, the patient rapidly regained her ability to ambulate,  void, and tolerate regular diet.  She was discharged home on third  postoperative day in satisfactory condition.   DISCHARGE INSTRUCTIONS:  Per booklet and follow up in 1 week for staple  removal.   DISCHARGE MEDICATIONS:  Dilaudid 4 mg p.o. q.4 h. p.r.n. pain.   DISPOSITION AT DISCHARGE:  Satisfactory.      Lenoard Aden, M.D.  Electronically Signed     Lenoard Aden, M.D.  Electronically Signed    RJT/MEDQ  D:  06/27/2008  T:  06/28/2008  Job:  [id number]

## 2011-04-15 NOTE — Op Note (Signed)
Laurie Oneal, KNABE NO.:  [id number]   MEDICAL RECORD NO.:  [id number]          PATIENT TYPE:  INP   LOCATION:  9124                          [facility]ACILITY:  [facility]   PHYSICIAN:  Gerri Spore B. Earlene Plater, M.D.  DATE OF BIRTH:  1971/07/31   DATE OF PROCEDURE:  09/08/2005  DATE OF DISCHARGE:                                 OPERATIVE REPORT   PREOPERATIVE DIAGNOSES:  1.  Thirty-eight intrauterine pregnancy.  2.  Previous cesarean section.  3.  Mature fetal pulmonary studies.  4.  Persistent and worsening nausea and vomiting.   POSTOPERATIVE DIAGNOSES:  1.  Thirty-eight intrauterine pregnancy.  2.  Previous cesarean section.  3.  Mature fetal pulmonary studies.  4.  Persistent and worsening nausea and vomiting.   PROCEDURE:  Repeat low transverse cesarean section.   SURGEON:  Chester Holstein. Earlene Plater, M.D.   ASSISTANT:  None.   ANESTHESIA:  Spinal.   SPECIMENS:  None.   ESTIMATED BLOOD LOSS:  700.   COMPLICATIONS:  None.   FINDINGS:  A viable female, Apgars were 8 and 8.  Normal-appearing uterus,  tubes, and ovaries.  Weight pending in newborn nursery.   INDICATIONS:  Patient at 38 weeks with history of preterm cervical change  being managed with bed rest from 27 weeks until 34.  Status post  betamethasone at 27 weeks when she developed advanced cervical dilation up  to 4 cm.  Over the last week the patient has had progressively increasing  nausea and vomiting.  A complete metabolic profile and CBC were normal.  Blood pressure was borderline, 130s/80s.  The patient has had tremendous  difficulty with nausea and vomiting and unable to keep down liquids and not  responding to antiemetics.  The patient requested going ahead with a repeat  C-section.  I confirmed fetal pulmonary maturity today with amniocentesis.  L/S ratio was 2.8, PG was present.  Risks of surgery were discussed  including infection, bleeding, damage to surrounding organs.   PROCEDURE:  Patient taken to the  operating room and spinal anesthesia  obtained.  She was prepped and draped in standard fashion, Foley catheter  inserted in the bladder.   A Pfannenstiel incision made through the previous scar, carried sharply to  the fascia.  The fascia was divided sharply.  The underlying rectus muscles  were resected off sharply.  The posterior sheath of the peritoneum elevated  and entered sharply.  The bladder flap created sharply.   The bladder blade reinserted.  Uterine incision made in a low transverse  fashion with a knife.  Clear fluid at amniotomy.  The incision was continued  laterally with bandage scissors.  The infant's head was elevated to the  incision, but there was significant difficulty in delivering the vertex due  to extremely tense rectus muscles.  Therefore, the Kiwi vacuum was placed on  the suction point in the midline from the mid-green zone, two pulls  necessary, one pop-off encountered, infant subsequently delivered without  difficulty.  The nose and mouth were suctioned with the bulb and the  remainder of the infant delivered without difficulty.  Cord  clamped and cut  and the infant handed off to the awaiting pediatricians.  Ancef 1 g given at  cord clamp.   The placenta was removed manually, the uterus cleared of all clots and  debris.  The uterine incision was free of extension.  It was closed in two  layers with 0 chromic, good hemostasis obtained.  The pelvis irrigated, the  bladder flap, uterine incision and subfascial space were all hemostatic.  There was one bleeder on the left rectus muscle made hemostatic.  The fascia  was closed with a running stitch of 0 Vicryl.  The subcutaneous tissue was  irrigated and made hemostatic with the Bovie and reapproximated with  interrupted 0 Vicryl.  The skin was closed with staples.   The patient tolerated the procedure well with no complications.  She was  taken to the recovery room awake, alert, in stable condition.  All  counts  were correct per the operating room staff.      Gerri Spore B. Earlene Plater, M.D.  Electronically Signed     WBD/MEDQ  D:  09/08/2005  T:  09/09/2005  Job:  [id number]

## 2011-04-15 NOTE — Consult Note (Signed)
   NAME:  Laurie Oneal, Laurie Oneal NO.:  [id number]   MEDICAL RECORD NO.:  [id number]                   PATIENT TYPE:  EMS   LOCATION:  MINO                                 FACILITY:  [facility]   PHYSICIAN:  Marlan Palau, M.D.               DATE OF BIRTH:  05-Aug-1971   DATE OF CONSULTATION:  03/31/2003  DATE OF DISCHARGE:                                   CONSULTATION   REASON FOR CONSULTATION:  The patient is a 40 year old right-handed white female born [birth year], with  history of migraine headaches. The patient has ventral associated  migraine,has recently delivered a child, gone back on birth control pills  for the last two months.  This patient reports a headache that occurred  today that was preceded by focal neurologic symptoms.  The patient began  having problems with partial visual phenomenon with bright lights on the  right visual field.   Dictation ends here...                                               Marlan Palau, M.D.    CKW/MEDQ  D:  03/31/2003  T:  04/01/2003  Job:  [id number]

## 2011-08-25 LAB — CBC
HCT: 32.7 — ABNORMAL LOW
HCT: 37.7
Hemoglobin: 11.1 — ABNORMAL LOW
Hemoglobin: 13
MCHC: 34
MCHC: 34.5
MCV: 84.2
MCV: 86.2
Platelets: 125 — ABNORMAL LOW
Platelets: 154
RBC: 3.79 — ABNORMAL LOW
RBC: 4.48
RDW: 17 — ABNORMAL HIGH
RDW: 17.2 — ABNORMAL HIGH
WBC: 7.4
WBC: 8.4

## 2011-08-25 LAB — DIFFERENTIAL
Basophils Absolute: 0
Basophils Relative: 1
Eosinophils Absolute: 0
Eosinophils Relative: 0
Lymphocytes Relative: 19
Lymphs Abs: 1.4
Monocytes Absolute: 0.5
Monocytes Relative: 7
Neutro Abs: 5.5
Neutrophils Relative %: 74

## 2011-08-25 LAB — RPR: RPR Ser Ql: NONREACTIVE

## 2012-07-24 ENCOUNTER — Encounter (HOSPITAL_COMMUNITY): Payer: Self-pay | Admitting: *Deleted

## 2012-07-27 ENCOUNTER — Encounter (HOSPITAL_COMMUNITY): Payer: Self-pay | Admitting: Pharmacist

## 2012-08-07 ENCOUNTER — Other Ambulatory Visit: Payer: Self-pay | Admitting: Obstetrics and Gynecology

## 2012-08-12 MED ORDER — CLINDAMYCIN PHOSPHATE 900 MG/50ML IV SOLN
900.0000 mg | Freq: Once | INTRAVENOUS | Status: DC
  Filled 2012-08-12: qty 50

## 2012-08-12 MED ORDER — CLINDAMYCIN PHOSPHATE 900 MG/50ML IV SOLN
900.0000 mg | Freq: Once | INTRAVENOUS | Status: AC
  Administered 2012-08-13: 900 mg via INTRAVENOUS
  Filled 2012-08-12: qty 50

## 2012-08-12 NOTE — H&P (Signed)
NAMEJULIANNE, Laurie Oneal NO.:  [id number]  MEDICAL RECORD NO.:  [id number]  LOCATION:  PERIO                         [facility]ILITY:  [facility]  PHYSICIAN:  Lenoard Aden, M.D.DATE OF BIRTH:  07-03-1971  DATE OF ADMISSION:  07/02/2012 DATE OF DISCHARGE:                             HISTORY & PHYSICAL   DATE OF SURGERY:  August 13, 2012.  CHIEF COMPLAINT:  Menorrhagia.  HISTORY OF PRESENT ILLNESS:  She is a 41 year old, white female.  She is a G4, P3, history of C-section x3, who presents now for the treatment of menorrhagia and irregular bleeding in the form of hysteroscopy with possible resection, NovaSure ablation.  MEDICATIONS:  Include thyroid replacement therapy, birth control pills, multivitamins, and probiotics and amino acids.  SOCIAL HISTORY:  She is a nonsmoker, nondrinker.  She denies domestic or physical violence.  ALLERGIES:  To Augmentin, erythromycin, Imitrex, and Percocet.  FAMILY HISTORY:  Lung cancer, breast cancer, Graves disease, cerebral palsy, hypertension, emphysema, kidney stones, diabetes, and migraine headaches.  Contraception is tubal ligation done at the time of C-section.  PHYSICAL EXAMINATION:  GENERAL:  She is a well-developed, well- nourished, white female, in no acute distress. HEENT normal. NECK:  Supple.  Full range of motion. LUNGS:  Clear. HEART:  Regular rhythm. ABDOMEN:  Soft, nontender. PELVIC:  Reveals a normal size uterus with no adnexal masses, which is retroverted. EXTREMITIES:  There are no cords. NEUROLOGIC:  Nonfocal.  Skin is intact.  IMPRESSION:  Refractory menorrhagia.  Dysfunctional uterine bleeding for definitive therapy.  PLAN:  Diagnostic hysteroscopy, D and C, possible resectoscope and NovaSure endometrial ablation.  Risks of anesthesia, infection, bleeding, injury to abdominal organs, need for repair was discussed, delayed versus immediate complications to include bowel and bladder injury noted.   The patient acknowledges and wishes to proceed.     Lenoard Aden, M.D.     RJT/MEDQ  D:  08/12/2012  T:  08/12/2012  Job:  [id number]

## 2012-08-13 ENCOUNTER — Encounter (HOSPITAL_COMMUNITY): Payer: Self-pay | Admitting: Anesthesiology

## 2012-08-13 ENCOUNTER — Encounter (HOSPITAL_COMMUNITY)
Admission: RE | Disposition: A | Discharge status: Home / Self Care | Payer: Self-pay | Source: Ambulatory Visit | Attending: Obstetrics and Gynecology

## 2012-08-13 ENCOUNTER — Ambulatory Visit (HOSPITAL_COMMUNITY): Payer: BC Managed Care – PPO | Admitting: Anesthesiology

## 2012-08-13 ENCOUNTER — Ambulatory Visit (HOSPITAL_COMMUNITY)
Admission: RE | Admit: 2012-08-13 | Discharge: 2012-08-13 | Disposition: A | Discharge status: Home / Self Care | Payer: BC Managed Care – PPO | Source: Ambulatory Visit | Attending: Obstetrics and Gynecology | Admitting: Obstetrics and Gynecology

## 2012-08-13 DIAGNOSIS — N920 Excessive and frequent menstruation with regular cycle: Secondary | ICD-10-CM | POA: Insufficient documentation

## 2012-08-13 DIAGNOSIS — N840 Polyp of corpus uteri: Secondary | ICD-10-CM | POA: Insufficient documentation

## 2012-08-13 HISTORY — DX: Hypothyroidism, unspecified: E03.9

## 2012-08-13 HISTORY — DX: Headache: R51

## 2012-08-13 LAB — CBC
HCT: 43.4 % (ref 36.0–46.0)
Hemoglobin: 14.9 g/dL (ref 12.0–15.0)
MCHC: 34.3 g/dL (ref 30.0–36.0)
RBC: 4.89 MIL/uL (ref 3.87–5.11)
WBC: 6.9 10*3/uL (ref 4.0–10.5)

## 2012-08-13 LAB — HCG, SERUM, QUALITATIVE: Preg, Serum: NEGATIVE

## 2012-08-13 SURGERY — DILATATION & CURETTAGE/HYSTEROSCOPY WITH NOVASURE ABLATION
Anesthesia: General | Site: Uterus | Wound class: Clean Contaminated

## 2012-08-13 MED ORDER — BUPIVACAINE HCL (PF) 0.25 % IJ SOLN
INTRAMUSCULAR | Status: DC | PRN
  Administered 2012-08-13: 20 mL

## 2012-08-13 MED ORDER — KETOROLAC TROMETHAMINE 30 MG/ML IJ SOLN
INTRAMUSCULAR | Status: DC | PRN
  Administered 2012-08-13: 30 mg via INTRAVENOUS

## 2012-08-13 MED ORDER — KETOROLAC TROMETHAMINE 30 MG/ML IJ SOLN
INTRAMUSCULAR | Status: AC
  Filled 2012-08-13: qty 1

## 2012-08-13 MED ORDER — CLINDAMYCIN PHOSPHATE 900 MG/50ML IV SOLN
900.0000 mg | Freq: Once | INTRAVENOUS | Status: DC
  Filled 2012-08-13: qty 50

## 2012-08-13 MED ORDER — LIDOCAINE HCL (CARDIAC) 20 MG/ML IV SOLN
INTRAVENOUS | Status: AC
  Filled 2012-08-13: qty 5

## 2012-08-13 MED ORDER — FENTANYL CITRATE 0.05 MG/ML IJ SOLN
INTRAMUSCULAR | Status: DC | PRN
  Administered 2012-08-13: 100 ug via INTRAVENOUS

## 2012-08-13 MED ORDER — MIDAZOLAM HCL 2 MG/2ML IJ SOLN
INTRAMUSCULAR | Status: AC
  Filled 2012-08-13: qty 2

## 2012-08-13 MED ORDER — ONDANSETRON HCL 4 MG/2ML IJ SOLN
INTRAMUSCULAR | Status: AC
  Filled 2012-08-13: qty 2

## 2012-08-13 MED ORDER — FENTANYL CITRATE 0.05 MG/ML IJ SOLN
25.0000 ug | INTRAMUSCULAR | Status: DC | PRN
  Administered 2012-08-13 (×3): 50 ug via INTRAVENOUS

## 2012-08-13 MED ORDER — FENTANYL CITRATE 0.05 MG/ML IJ SOLN
INTRAMUSCULAR | Status: AC
  Filled 2012-08-13: qty 2

## 2012-08-13 MED ORDER — PROPOFOL 10 MG/ML IV EMUL
INTRAVENOUS | Status: DC | PRN
  Administered 2012-08-13: 150 mg via INTRAVENOUS

## 2012-08-13 MED ORDER — MEPERIDINE HCL 100 MG PO TABS: 100.0000 mg | ORAL_TABLET | ORAL | Status: DC | PRN

## 2012-08-13 MED ORDER — VASOPRESSIN 20 UNIT/ML IJ SOLN
INTRAMUSCULAR | Status: DC | PRN
  Administered 2012-08-13: 20 [IU]

## 2012-08-13 MED ORDER — PROPOFOL 10 MG/ML IV EMUL
INTRAVENOUS | Status: AC
  Filled 2012-08-13: qty 20

## 2012-08-13 MED ORDER — FENTANYL CITRATE 0.05 MG/ML IJ SOLN
INTRAMUSCULAR | Status: AC
  Administered 2012-08-13: 50 ug via INTRAVENOUS
  Filled 2012-08-13: qty 2

## 2012-08-13 MED ORDER — LACTATED RINGERS IV SOLN
INTRAVENOUS | Status: DC
  Administered 2012-08-13: 09:00:00 via INTRAVENOUS

## 2012-08-13 MED ORDER — DEXAMETHASONE SODIUM PHOSPHATE 4 MG/ML IJ SOLN
INTRAMUSCULAR | Status: DC | PRN
  Administered 2012-08-13: 10 mg via INTRAVENOUS

## 2012-08-13 MED ORDER — BUPIVACAINE HCL (PF) 0.25 % IJ SOLN
INTRAMUSCULAR | Status: AC
  Filled 2012-08-13: qty 30

## 2012-08-13 MED ORDER — DEXAMETHASONE SODIUM PHOSPHATE 10 MG/ML IJ SOLN
INTRAMUSCULAR | Status: AC
  Filled 2012-08-13: qty 1

## 2012-08-13 MED ORDER — SODIUM CHLORIDE 0.9 % IJ SOLN
INTRAMUSCULAR | Status: DC | PRN
  Administered 2012-08-13: 50 mL via INTRAVENOUS

## 2012-08-13 MED ORDER — ONDANSETRON HCL 4 MG/2ML IJ SOLN
INTRAMUSCULAR | Status: DC | PRN
  Administered 2012-08-13: 4 mg via INTRAVENOUS

## 2012-08-13 MED ORDER — MIDAZOLAM HCL 5 MG/5ML IJ SOLN
INTRAMUSCULAR | Status: DC | PRN
  Administered 2012-08-13: 2 mg via INTRAVENOUS

## 2012-08-13 MED ORDER — VASOPRESSIN 20 UNIT/ML IJ SOLN
INTRAMUSCULAR | Status: AC
  Filled 2012-08-13: qty 1

## 2012-08-13 MED ORDER — LIDOCAINE HCL (CARDIAC) 20 MG/ML IV SOLN
INTRAVENOUS | Status: DC | PRN
  Administered 2012-08-13: 50 mg via INTRAVENOUS

## 2012-08-13 SURGICAL SUPPLY — 14 items
ABLATOR ENDOMETRIAL BIPOLAR (ABLATOR) ×2 IMPLANT
CATH ROBINSON RED A/P 16FR (CATHETERS) ×2 IMPLANT
CLOTH BEACON ORANGE TIMEOUT ST (SAFETY) ×2 IMPLANT
CONTAINER PREFILL 10% NBF 60ML (FORM) ×3 IMPLANT
DRESSING TELFA 8X3 (GAUZE/BANDAGES/DRESSINGS) ×2 IMPLANT
GLOVE BIO SURGEON STRL SZ7.5 (GLOVE) ×4 IMPLANT
GOWN PREVENTION PLUS XLARGE (GOWN DISPOSABLE) ×1 IMPLANT
GOWN STRL REIN XL XLG (GOWN DISPOSABLE) ×2 IMPLANT
PACK HYSTEROSCOPY LF (CUSTOM PROCEDURE TRAY) ×2 IMPLANT
PAD OB MATERNITY 4.3X12.25 (PERSONAL CARE ITEMS) ×2 IMPLANT
PAD PREP 24X48 CUFFED NSTRL (MISCELLANEOUS) ×2 IMPLANT
SYR TB 1ML 25GX5/8 (SYRINGE) ×2 IMPLANT
TOWEL OR 17X24 6PK STRL BLUE (TOWEL DISPOSABLE) ×4 IMPLANT
WATER STERILE IRR 1000ML POUR (IV SOLUTION) ×2 IMPLANT

## 2012-08-13 NOTE — Progress Notes (Signed)
Patient ID: Laurie Oneal, female   DOB: March 20, 1971, 41 y.o.   MRN: [id number] Patient seen and examined. Consent witnessed and signed. No changes noted. HCG negative Update completed.

## 2012-08-13 NOTE — Anesthesia Preprocedure Evaluation (Addendum)
Anesthesia Evaluation  Patient identified by MRN, date of birth, ID band Patient awake    Reviewed: Allergy & Precautions, H&P , Patient's Chart, lab work & pertinent test results, reviewed documented beta blocker date and time   Airway Mallampati: II TM Distance: >3 FB Neck ROM: full    Dental No notable dental hx.    Pulmonary  breath sounds clear to auscultation  Pulmonary exam normal       Cardiovascular Rhythm:regular Rate:Normal     Neuro/Psych    GI/Hepatic   Endo/Other  Hypothyroidism   Renal/GU      Musculoskeletal   Abdominal   Peds  Hematology   Anesthesia Other Findings   Reproductive/Obstetrics                          Anesthesia Physical Anesthesia Plan  ASA: II  Anesthesia Plan: General   Post-op Pain Management:    Induction: Intravenous  Airway Management Planned: LMA  Additional Equipment:   Intra-op Plan:   Post-operative Plan:   Informed Consent: I have reviewed the patients History and Physical, chart, labs and discussed the procedure including the risks, benefits and alternatives for the proposed anesthesia with the patient or authorized representative who has indicated his/her understanding and acceptance.   Dental Advisory Given and Dental advisory given  Plan Discussed with: CRNA and Surgeon  Anesthesia Plan Comments: (  Discussed  general anesthesia, including possible nausea, instrumentation of airway, sore throat,pulmonary aspiration, etc. I asked if the were any outstanding questions, or  concerns before we proceeded. )       Anesthesia Quick Evaluation

## 2012-08-13 NOTE — Transfer of Care (Signed)
Immediate Anesthesia Transfer of Care Note  Patient: Laurie Oneal  Procedure(s) Performed: Procedure(s) (LRB) with comments: DILATATION & CURETTAGE/HYSTEROSCOPY WITH NOVASURE ABLATION (N/A) - Possible resectoscope.  Patient Location: PACU  Anesthesia Type: General  Level of Consciousness: awake, oriented and sedated  Airway & Oxygen Therapy: Patient Spontanous Breathing and Patient connected to nasal cannula oxygen  Post-op Assessment: Report given to PACU RN and Post -op Vital signs reviewed and stable  Post vital signs: Reviewed and stable  Complications: No apparent anesthesia complications

## 2012-08-13 NOTE — Anesthesia Postprocedure Evaluation (Signed)
  Anesthesia Post-op Note  Patient: Laurie Oneal  Procedure(s) Performed: Procedure(s) (LRB) with comments: DILATATION & CURETTAGE/HYSTEROSCOPY WITH NOVASURE ABLATION (N/A) - Possible resectoscope.  Patient is awake and responsive. Pain and nausea are reasonably well controlled. Vital signs are stable and clinically acceptable. Oxygen saturation is clinically acceptable. There are no apparent anesthetic complications at this time. Patient is ready for discharge.

## 2012-08-13 NOTE — Op Note (Signed)
08/13/2012  9:46 AM  PATIENT:  Conrad Andover  41 y.o. female  PRE-OPERATIVE DIAGNOSIS:  Menorrhagia  POST-OPERATIVE DIAGNOSIS:  Menorrhagia  PROCEDURE:  Procedure(s): DILATATION & CURETTAGE/HYSTEROSCOPY WITH NOVASURE ABLATION ENDOMETRIAL POLYPECTOMY X 2  SURGEON:  Surgeon(s): Lenoard Aden, MD  ASSISTANTS: none   ANESTHESIA:   local and general  ESTIMATED BLOOD LOSS: * No blood loss amount entered *   DRAINS: none   LOCAL MEDICATIONS USED:  MARCAINE     SPECIMEN:  Source of Specimen:  EMC and polyp  DISPOSITION OF SPECIMEN:  PATHOLOGY  COUNTS:  YES  DICTATION #: [id number]  PLAN OF CARE: DC home  PATIENT DISPOSITION:  PACU - hemodynamically stable.

## 2012-08-13 NOTE — Anesthesia Procedure Notes (Signed)
Date/Time: 08/13/2012 9:20 AM Performed by: Isabella Bowens R Pre-anesthesia Checklist: Patient identified, Emergency Drugs available, Suction available, Patient being monitored and Timeout performed Patient Re-evaluated:Patient Re-evaluated prior to inductionOxygen Delivery Method: Circle system utilized Preoxygenation: Pre-oxygenation with 100% oxygen Intubation Type: IV induction Ventilation: Mask ventilation without difficulty LMA: LMA inserted LMA Size: 3.0 Grade View: Grade II Number of attempts: 1 Placement Confirmation: positive ETCO2 and breath sounds checked- equal and bilateral Dental Injury: Teeth and Oropharynx as per pre-operative assessment  Difficulty Due To: Difficulty was unanticipated

## 2012-08-14 NOTE — Op Note (Signed)
NAME:  Laurie Oneal, Laurie Oneal NO.:  [id number]  MEDICAL RECORD NO.:  [id number]  LOCATION:  [facility]                          FACILITY:  [facility]  PHYSICIAN:  Lenoard Aden, M.D.DATE OF BIRTH:  [birth year]  DATE OF PROCEDURE: DATE OF DISCHARGE:  08/13/2012                              OPERATIVE REPORT   DESCRIPTION OF PROCEDURE:  After being apprised of the risks of anesthesia, infection, bleeding, injury to abdominal organs, and possible need for repair, delayed versus immediate complications to include bowel and bladder injury, possible need for repair, the patient was brought to the operating room where she was administered a general anesthetic without complications, prepped and draped in sterile fashion, catheterized until the bladder was empty.  Feet were placed in Yellofin stirrups.  Exam under anesthesia revealed a midposition to retroverted uterus and no adnexal masses.  Uterus sounds to 9 cm, cervix to 2.5. Cervix was easily dilated up to a 23 Pratt dilator.  Cystoscope was placed.  Visualization revealed two sessile endometrial polyps with fundus, which were extracted using polyp forceps without difficulty, otherwise normal endometrial cavity.  Sharp curettage was used in a 4- quadrant method to collect the endometrial curettings.  Bilateral normal tubal ostia were noted.  No other structural lesions were noted in the endometrium.  At this time after placing the NovaSure device, the CO2 test was performed and was negative.  The NovaSure seated to a width of 3.5 cm, it was then initiated up to a negative test to a power of 122 watts for 55 seconds.  The instrument was then removed, inspected and found to be intact.  Revisualization revealed a well-ablated endometrial cavity, no evidence of uterine perforation.  Fluid deficit 50 mL noted. The patient tolerated the procedure well, was awakened and then transferred to the recovery room in good condition.  Please note  prior to the performance of this procedure after placement of the speculum, dilute Marcaine solution was placed for 20 mL with repair of cervical block and a dilute Pitocin solution was placed through a 9 o'clock, 18 mL total at the cervical-vaginal junction at 3 and 9 o'clock.     Lenoard Aden, M.D.     RJT/MEDQ  D:  08/13/2012  T:  08/14/2012  Job:  [id number]

## 2012-08-30 ENCOUNTER — Inpatient Hospital Stay (HOSPITAL_COMMUNITY): Payer: BC Managed Care – PPO

## 2012-08-30 ENCOUNTER — Inpatient Hospital Stay (HOSPITAL_COMMUNITY)
Admission: AD | Admit: 2012-08-30 | Discharge: 2012-08-30 | Disposition: A | Discharge status: Home / Self Care | Payer: BC Managed Care – PPO | Source: Ambulatory Visit | Attending: Obstetrics and Gynecology | Admitting: Obstetrics and Gynecology

## 2012-08-30 ENCOUNTER — Encounter (HOSPITAL_COMMUNITY): Payer: Self-pay

## 2012-08-30 DIAGNOSIS — IMO0002 Reserved for concepts with insufficient information to code with codable children: Secondary | ICD-10-CM | POA: Insufficient documentation

## 2012-08-30 LAB — CBC
Platelets: 272 10*3/uL (ref 150–400)
RBC: 4.44 MIL/uL (ref 3.87–5.11)
RDW: 12.8 % (ref 11.5–15.5)
WBC: 11 10*3/uL — ABNORMAL HIGH (ref 4.0–10.5)

## 2012-08-30 LAB — WET PREP, GENITAL: Yeast Wet Prep HPF POC: NONE SEEN

## 2012-08-30 MED ORDER — HYDROCODONE-ACETAMINOPHEN 5-325 MG PO TABS
1.0000 | ORAL_TABLET | Freq: Once | ORAL | Status: AC
  Administered 2012-08-30: 1 via ORAL
  Filled 2012-08-30: qty 1

## 2012-08-30 MED ORDER — HYDROCODONE-ACETAMINOPHEN 10-325 MG PO TABS: 1.0000 | ORAL_TABLET | Freq: Once | ORAL | Status: DC

## 2012-08-30 NOTE — MAU Note (Signed)
Incident relayed to MD: Had a sharp pain in back, got up to change pad, became nauseous, dizzy and diaphoretic on the way to the bathroom. Was aware the entire time, but fell, hit counter. Causes discussed with Dr Billy Coast.

## 2012-08-30 NOTE — MAU Provider Note (Signed)
  History   Discharge and bleeding after Novasure CSN: [id number]  Arrival date and time: 08/30/12 4098   None     Chief Complaint  Patient presents with  . Vaginal Bleeding   HPI  Pertinent Gynecological History: Menses: flow is light Bleeding: intermenstrual bleeding Contraception: none DES exposure: denies Blood transfusions: none Sexually transmitted diseases: no past history Previous GYN Procedures: DNC  Last mammogram: normal Date: nl Last pap: normal Date: na   Past Medical History  Diagnosis Date  . Hypothyroidism   . Headache     Past Surgical History  Procedure Date  . Cesarean section     x3    History reviewed. No pertinent family history.  History  Substance Use Topics  . Smoking status: Never Smoker   . Smokeless tobacco: Not on file  . Alcohol Use: Yes     occasionally    Allergies:  Allergies  Allergen Reactions  . Percocet (Oxycodone-Acetaminophen) Nausea And Vomiting and Other (See Comments)    migraine  . Amoxicillin-Pot Clavulanate Hives  . Erythromycin Other (See Comments)    Severe GI pains  . Migranal (Dihydroergotamine) Nausea And Vomiting    Uncontrollable vomiting    Prescriptions prior to admission  Medication Sig Dispense Refill  . Amino Acids (ESSENTIAL AMINO ACID MIX PO) Take 1 tablet by mouth daily.      Marland Kitchen b complex vitamins tablet Take 1 tablet by mouth daily. Takes sporatically      . Cholecalciferol (VITAMIN D PO) Take 1 capsule by mouth daily. Takes sporatically      . GLUCOSAMINE PO Take 1 capsule by mouth daily.      Marland Kitchen KRILL OIL PO Take 1 capsule by mouth daily.      Marland Kitchen levothyroxine (SYNTHROID, LEVOTHROID) 75 MCG tablet Take 75 mcg by mouth daily.      Marland Kitchen MAGNESIUM-POTASSIUM PO Take 1 tablet by mouth daily. Takes sporatically      . meperidine (DEMEROL) 100 MG tablet Take 1 tablet (100 mg total) by mouth every 4 (four) hours as needed for pain.  30 tablet  0  . Multiple Vitamin (MULTIVITAMIN WITH MINERALS) TABS  Take 1 tablet by mouth daily.      Marland Kitchen zonisamide (ZONEGRAN) 100 MG capsule Take 400 mg by mouth daily.      Marland Kitchen OVER THE COUNTER MEDICATION Take 1 capsule by mouth daily. Daily Stress Formula        ROS Physical Exam   Blood pressure 102/65, pulse 80, temperature 97 F (36.1 C), resp. rate 16, SpO2 100.00%.  Physical Exam  MAU Course  Procedures  MDM na  Assessment and Plan  Discharge and bleeding after Novasure Possible post op endometritis Continue Doxycycline and dc home  Majour Frei J 08/30/2012, 6:54 AM

## 2012-08-30 NOTE — MAU Note (Signed)
Pt states had uterine ablasion on 9/16. Sunday night started to have pain and bleeding heavily. States she vomited also. Has been bleeding and having a foul odor with the discharge since Wed.

## 2012-08-30 NOTE — H&P (Signed)
NAMELANECIA, PROFFIT NO.:  [id number]  MEDICAL RECORD NO.:  [id number]  LOCATION:  [id number]                          FACILITY:  [facility]  PHYSICIAN:  Lenoard Aden, M.D.DATE OF BIRTH:  10/28/1971  DATE OF ADMISSION:  08/30/2012 DATE OF DISCHARGE:  08/30/2012                             HISTORY & PHYSICAL   CHIEF COMPLAINT:  Increased discharge, pain, and bleeding.  HISTORY OF PRESENT ILLNESS:  She is a 41 year old white female, G4, P3, status post 3 C sections and tubal ligation, status post uncomplicated NovaSure procedure with polypectomy on August 13, 2012.  She presents now 2-1/2 half weeks later with increased vaginal discharge, sudden onset of increased bleeding and pain.  PAST MEDICAL HISTORY:  Remarkable for C-section x3.  No other medical or surgical hospitalizations.  FAMILY HISTORY:  Noncontributory.  REVIEW OF SYSTEMS:  Otherwise remarkable for feeling dizzy in the middle of the night which caused her to take a small fall in the bathroom in the patient's words.  She then looked at her tongue and gums and decided that she had lost too much blood and came to the hospital.  PHYSICAL EXAMINATION:  GENERAL:  She is a well-developed, well- nourished, white female, in no acute distress. VITAL SIGNS:  Stable.  Blood pressure is normal, pulse 72-80, temperature 97. HEENT:  Normal. NECK:  Supple.  Full range of motion. LUNGS:  Clear. HEART:  Regular rhythm. ABDOMEN:  Soft, nontender. PELVIC:  Reveals a small mid positioned uterus with no adnexal masses. Slight uterine tenderness noted.  White blood cell count is 11, hemoglobin is 13.5.  Pelvic ultrasound is noncontributory with a normal-appearing uterus and bilateral normal adnexa.  Wet prep is negative.  IMPRESSION:  Normal postoperative findings status post NovaSure, questionable postoperative endometritis with slightly elevated white blood cell count.  PLAN:  Discharge home.  Doxycycline 100 mg  p.o. b.i.d. x7 days Reassurance given.  Follow up for normal postop visit.     Lenoard Aden, M.D.     RJT/MEDQ  D:  08/30/2012  T:  08/30/2012  Job:  [id number]

## 2012-08-30 NOTE — MAU Note (Signed)
Heavy discharge started on Sunday. Had appt with Dr Billy Coast for this afternoon.  When 'passed out' at home tonight, felt could not wait.

## 2013-08-01 ENCOUNTER — Encounter: Payer: Self-pay | Admitting: Family Medicine

## 2013-08-01 ENCOUNTER — Ambulatory Visit (INDEPENDENT_AMBULATORY_CARE_PROVIDER_SITE_OTHER): Payer: Worker's Compensation | Admitting: Family Medicine

## 2013-08-01 VITALS — BP 114/72 | Temp 97.5°F | Wt 107.0 lb

## 2013-08-01 DIAGNOSIS — W5501XA Bitten by cat, initial encounter: Secondary | ICD-10-CM

## 2013-08-01 DIAGNOSIS — T148XXA Other injury of unspecified body region, initial encounter: Secondary | ICD-10-CM

## 2013-08-01 DIAGNOSIS — IMO0001 Reserved for inherently not codable concepts without codable children: Secondary | ICD-10-CM

## 2013-08-01 NOTE — Patient Instructions (Addendum)
Go directly to [facility] now at [address]

## 2013-08-01 NOTE — Progress Notes (Signed)
Chief Complaint  Patient presents with  . Animal Bite    HPI:  Acute visit for cate bite: -last tetanus in 2010 per patient -bite occurred: at 10:30 today - 2 very deep puncture wounds on R R hand, she washed them with betadine and saline, wound have been swelling significantly and and she is having severe pain - she has iced hand extensively and swelling somewhat improved -denies: fevers, shills, redness up wrist or arm -allergic to augmentin   ROS: See pertinent positives and negatives per HPI.  Past Medical History  Diagnosis Date  . Hypothyroidism   . ZOXWRUEA(540.9)     Past Surgical History  Procedure Laterality Date  . Cesarean section      x3    No family history on file.  History   Social History  . Marital Status: Legally Separated    Spouse Name: N/A    Number of Children: N/A  . Years of Education: N/A   Social History Main Topics  . Smoking status: Never Smoker   . Smokeless tobacco: None  . Alcohol Use: Yes     Comment: occasionally  . Drug Use: No  . Sexual Activity:    Other Topics Concern  . None   Social History Narrative  . None    Current outpatient prescriptions:Amino Acids (ESSENTIAL AMINO ACID MIX PO), Take 1 tablet by mouth daily., Disp: , Rfl: ;  b complex vitamins tablet, Take 1 tablet by mouth daily. Takes sporatically, Disp: , Rfl: ;  Cholecalciferol (VITAMIN D PO), Take 1 capsule by mouth daily. Takes sporatically, Disp: , Rfl: ;  GLUCOSAMINE PO, Take 1 capsule by mouth daily., Disp: , Rfl: ;  KRILL OIL PO, Take 1 capsule by mouth daily., Disp: , Rfl:  levothyroxine (SYNTHROID, LEVOTHROID) 75 MCG tablet, Take 75 mcg by mouth daily., Disp: , Rfl: ;  MAGNESIUM-POTASSIUM PO, Take 1 tablet by mouth daily. Takes sporatically, Disp: , Rfl: ;  meperidine (DEMEROL) 100 MG tablet, Take 1 tablet (100 mg total) by mouth every 4 (four) hours as needed for pain., Disp: 30 tablet, Rfl: 0;  Multiple Vitamin (MULTIVITAMIN WITH MINERALS) TABS, Take 1  tablet by mouth daily., Disp: , Rfl:  OVER THE COUNTER MEDICATION, Take 1 capsule by mouth daily. Daily Stress Formula, Disp: , Rfl: ;  zonisamide (ZONEGRAN) 100 MG capsule, Take 400 mg by mouth daily., Disp: , Rfl:   EXAM:  Filed Vitals:   08/01/13 1318  BP: 114/72  Temp: 97.5 F (36.4 C)    Body mass index is 20.9 kg/(m^2).  GENERAL: vitals reviewed and listed above, alert, oriented, appears well hydrated and in no acute distress  HEENT: atraumatic, conjunttiva clear, no obvious abnormalities on inspection of external nose and ears  NECK: no obvious masses on inspection  MS/SKIN: moves all extremities without noticeable abnormality -two bleeding deep puncture wounds on R dorsal  hand with swelling in both areas TTP  PSYCH: pleasant and cooperative, no obvious depression or anxiety  ASSESSMENT AND PLAN:  Discussed the following assessment and plan:  Cat bite, initial encounter  -given deep puncture wounds and rapid swelling - advised needs to see  surgeon urgently today Elease Hashimoto called surgical center and patient will [facility] to [facility] -Patient advised to return or notify a doctor immediately if symptoms worsen or persist or new concerns arise.  There are no Patient Instructions on file for this visit.   Kriste Basque R.

## 2014-01-22 ENCOUNTER — Encounter (HOSPITAL_COMMUNITY): Payer: Self-pay | Admitting: Emergency Medicine

## 2014-01-22 ENCOUNTER — Emergency Department (HOSPITAL_COMMUNITY)
Admission: EM | Admit: 2014-01-22 | Discharge: 2014-01-22 | Disposition: A | Discharge status: Home / Self Care | Payer: BC Managed Care – PPO | Attending: Emergency Medicine | Admitting: Emergency Medicine

## 2014-01-22 DIAGNOSIS — R51 Headache: Secondary | ICD-10-CM | POA: Insufficient documentation

## 2014-01-22 DIAGNOSIS — Z91040 Latex allergy status: Secondary | ICD-10-CM | POA: Insufficient documentation

## 2014-01-22 DIAGNOSIS — G40909 Epilepsy, unspecified, not intractable, without status epilepticus: Secondary | ICD-10-CM | POA: Insufficient documentation

## 2014-01-22 DIAGNOSIS — Z79899 Other long term (current) drug therapy: Secondary | ICD-10-CM | POA: Insufficient documentation

## 2014-01-22 DIAGNOSIS — R519 Headache, unspecified: Secondary | ICD-10-CM

## 2014-01-22 DIAGNOSIS — E039 Hypothyroidism, unspecified: Secondary | ICD-10-CM | POA: Insufficient documentation

## 2014-01-22 DIAGNOSIS — R110 Nausea: Secondary | ICD-10-CM | POA: Insufficient documentation

## 2014-01-22 DIAGNOSIS — H53149 Visual discomfort, unspecified: Secondary | ICD-10-CM | POA: Insufficient documentation

## 2014-01-22 HISTORY — DX: Unspecified convulsions: R56.9

## 2014-01-22 MED ORDER — SODIUM CHLORIDE 0.9 % IV BOLUS (SEPSIS)
1000.0000 mL | Freq: Once | INTRAVENOUS | Status: AC
  Administered 2014-01-22: 1000 mL via INTRAVENOUS

## 2014-01-22 MED ORDER — KETOROLAC TROMETHAMINE 15 MG/ML IJ SOLN
15.0000 mg | Freq: Once | INTRAMUSCULAR | Status: AC
  Administered 2014-01-22: 15 mg via INTRAVENOUS
  Filled 2014-01-22: qty 1

## 2014-01-22 MED ORDER — METOCLOPRAMIDE HCL 5 MG/ML IJ SOLN
10.0000 mg | Freq: Once | INTRAMUSCULAR | Status: AC
  Administered 2014-01-22: 10 mg via INTRAVENOUS
  Filled 2014-01-22: qty 2

## 2014-01-22 MED ORDER — MORPHINE SULFATE 4 MG/ML IJ SOLN
6.0000 mg | Freq: Once | INTRAMUSCULAR | Status: AC
  Administered 2014-01-22: 6 mg via INTRAVENOUS
  Filled 2014-01-22: qty 2

## 2014-01-22 MED ORDER — DIPHENHYDRAMINE HCL 50 MG/ML IJ SOLN
25.0000 mg | Freq: Once | INTRAMUSCULAR | Status: AC
  Administered 2014-01-22: 25 mg via INTRAVENOUS
  Filled 2014-01-22: qty 1

## 2014-01-22 MED ORDER — BUPIVACAINE HCL (PF) 0.5 % IJ SOLN
10.0000 mL | Freq: Once | INTRAMUSCULAR | Status: AC
  Administered 2014-01-22: 10 mL
  Filled 2014-01-22: qty 10

## 2014-01-22 NOTE — ED Notes (Signed)
Pt c/o migraine that started at 10am this morning, reports she takes daily meds for them. C/o aura feeling around 0945 am today. Pt tearful. sts she has never had one this bad before. Nad, skin warm and dry, resp e/u .

## 2014-01-22 NOTE — Discharge Instructions (Signed)
Trigger Point Injection Trigger points are areas where you have muscle pain. A trigger point injection is a shot given in the trigger point to relieve that pain. A trigger point might feel like a knot in your muscle. It hurts to press on a trigger point. Sometimes the pain spreads out (radiates) to other parts of the body. For example, pressing on a trigger point in your shoulder might cause pain in your arm or neck. You might have one trigger point. Or, you might have more than one. People often have trigger points in their upper back and lower back. They also occur often in the neck and shoulders. Pain from a trigger point lasts for a long time. It can make it hard to keep moving. You might not be able to do the exercise or physical therapy that could help you deal with the pain. A trigger point injection may help. It does not work for everyone. But, it may relieve your pain for a few days or a few months. A trigger point injection does not cure long-lasting (chronic) pain. LET YOUR CAREGIVER KNOW ABOUT:  Any allergies (especially to latex, lidocaine, or steroids). Headaches, Frequently Asked Questions MIGRAINE HEADACHES Q: What is migraine? What causes it? How can I treat it? A: Generally, migraine headaches begin as a dull ache. Then they develop into a constant, throbbing, and pulsating pain. You may experience pain at the temples. You may experience pain at the front or back of one or both sides of the head. The pain is usually accompanied by a combination of: Nausea. Vomiting. Sensitivity to light and noise. Some people (about 15%) experience an aura (see below) before an attack. The cause of migraine is believed to be chemical reactions in the brain. Treatment for migraine may include over-the-counter or prescription medications. It may also include self-help techniques. These include relaxation training and biofeedback.  Q: What is an aura? A: About 15% of people with migraine get an "aura".  This is a sign of neurological symptoms that occur before a migraine headache. You may see wavy or jagged lines, dots, or flashing lights. You might experience tunnel vision or blind spots in one or both eyes. The aura can include visual or auditory hallucinations (something imagined). It may include disruptions in smell (such as strange odors), taste or touch. Other symptoms include: Numbness. A "pins and needles" sensation. Difficulty in recalling or speaking the correct word. These neurological events may last as long as 60 minutes. These symptoms will fade as the headache begins. Q: What is a trigger? A: Certain physical or environmental factors can lead to or "trigger" a migraine. These include: Foods. Hormonal changes. Weather. Stress. It is important to remember that triggers are different for everyone. To help prevent migraine attacks, you need to figure out which triggers affect you. Keep a headache diary. This is a good way to track triggers. The diary will help you talk to your healthcare professional about your condition. Q: Does weather affect migraines? A: Bright sunshine, hot, humid conditions, and drastic changes in barometric pressure may lead to, or "trigger," a migraine attack in some people. But studies have shown that weather does not act as a trigger for everyone with migraines. Q: What is the link between migraine and hormones? A: Hormones start and regulate many of your body's functions. Hormones keep your body in balance within a constantly changing environment. The levels of hormones in your body are unbalanced at times. Examples are during menstruation, pregnancy, or  menopause. That can lead to a migraine attack. In fact, about three quarters of all women with migraine report that their attacks are related to the menstrual cycle.  Q: Is there an increased risk of stroke for migraine sufferers? A: The likelihood of a migraine attack causing a stroke is very remote. That is  not to say that migraine sufferers cannot have a stroke associated with their migraines. In persons under age 41, the most common associated factor for stroke is migraine headache. But over the course of a person's normal life span, the occurrence of migraine headache may actually be associated with a reduced risk of dying from cerebrovascular disease due to stroke.  Q: What are acute medications for migraine? A: Acute medications are used to treat the pain of the headache after it has started. Examples over-the-counter medications, NSAIDs, ergots, and triptans.  Q: What are the triptans? A: Triptans are the newest class of abortive medications. They are specifically targeted to treat migraine. Triptans are vasoconstrictors. They moderate some chemical reactions in the brain. The triptans work on receptors in your brain. Triptans help to restore the balance of a neurotransmitter called serotonin. Fluctuations in levels of serotonin are thought to be a main cause of migraine.  Q: Are over-the-counter medications for migraine effective? A: Over-the-counter, or "OTC," medications may be effective in relieving mild to moderate pain and associated symptoms of migraine. But you should see your caregiver before beginning any treatment regimen for migraine.  Q: What are preventive medications for migraine? A: Preventive medications for migraine are sometimes referred to as "prophylactic" treatments. They are used to reduce the frequency, severity, and length of migraine attacks. Examples of preventive medications include antiepileptic medications, antidepressants, beta-blockers, calcium channel blockers, and NSAIDs (nonsteroidal anti-inflammatory drugs). Q: Why are anticonvulsants used to treat migraine? A: During the past few years, there has been an increased interest in antiepileptic drugs for the prevention of migraine. They are sometimes referred to as "anticonvulsants". Both epilepsy and migraine may be  caused by similar reactions in the brain.  Q: Why are antidepressants used to treat migraine? A: Antidepressants are typically used to treat people with depression. They may reduce migraine frequency by regulating chemical levels, such as serotonin, in the brain.  Q: What alternative therapies are used to treat migraine? A: The term "alternative therapies" is often used to describe treatments considered outside the scope of conventional Western medicine. Examples of alternative therapy include acupuncture, acupressure, and yoga. Another common alternative treatment is herbal therapy. Some herbs are believed to relieve headache pain. Always discuss alternative therapies with your caregiver before proceeding. Some herbal products contain arsenic and other toxins. TENSION HEADACHES Q: What is a tension-type headache? What causes it? How can I treat it? A: Tension-type headaches occur randomly. They are often the result of temporary stress, anxiety, fatigue, or anger. Symptoms include soreness in your temples, a tightening band-like sensation around your head (a "vice-like" ache). Symptoms can also include a pulling feeling, pressure sensations, and contracting head and neck muscles. The headache begins in your forehead, temples, or the back of your head and neck. Treatment for tension-type headache may include over-the-counter or prescription medications. Treatment may also include self-help techniques such as relaxation training and biofeedback. CLUSTER HEADACHES Q: What is a cluster headache? What causes it? How can I treat it? A: Cluster headache gets its name because the attacks come in groups. The pain arrives with little, if any, warning. It is usually on one side of  the head. A tearing or bloodshot eye and a runny nose on the same side of the headache may also accompany the pain. Cluster headaches are believed to be caused by chemical reactions in the brain. They have been described as the most severe  and intense of any headache type. Treatment for cluster headache includes prescription medication and oxygen. SINUS HEADACHES Q: What is a sinus headache? What causes it? How can I treat it? A: When a cavity in the bones of the face and skull (a sinus) becomes inflamed, the inflammation will cause localized pain. This condition is usually the result of an allergic reaction, a tumor, or an infection. If your headache is caused by a sinus blockage, such as an infection, you will probably have a fever. An x-ray will confirm a sinus blockage. Your caregiver's treatment might include antibiotics for the infection, as well as antihistamines or decongestants.  REBOUND HEADACHES Q: What is a rebound headache? What causes it? How can I treat it? A: A pattern of taking acute headache medications too often can lead to a condition known as "rebound headache." A pattern of taking too much headache medication includes taking it more than 2 days per week or in excessive amounts. That means more than the label or a caregiver advises. With rebound headaches, your medications not only stop relieving pain, they actually begin to cause headaches. Doctors treat rebound headache by tapering the medication that is being overused. Sometimes your caregiver will gradually substitute a different type of treatment or medication. Stopping may be a challenge. Regularly overusing a medication increases the potential for serious side effects. Consult a caregiver if you regularly use headache medications more than 2 days per week or more than the label advises. ADDITIONAL QUESTIONS AND ANSWERS Q: What is biofeedback? A: Biofeedback is a self-help treatment. Biofeedback uses special equipment to monitor your body's involuntary physical responses. Biofeedback monitors: Breathing. Pulse. Heart rate. Temperature. Muscle tension. Brain activity. Biofeedback helps you refine and perfect your relaxation exercises. You learn to control the  physical responses that are related to stress. Once the technique has been mastered, you do not need the equipment any more. Q: Are headaches hereditary? A: Four out of five (80%) of people that suffer report a family history of migraine. Scientists are not sure if this is genetic or a family predisposition. Despite the uncertainty, a child has a 50% chance of having migraine if one parent suffers. The child has a 75% chance if both parents suffer.  Q: Can children get headaches? A: By the time they reach high school, most young people have experienced some type of headache. Many safe and effective approaches or medications can prevent a headache from occurring or stop it after it has begun.  Q: What type of doctor should I see to diagnose and treat my headache? A: Start with your primary caregiver. Discuss his or her experience and approach to headaches. Discuss methods of classification, diagnosis, and treatment. Your caregiver may decide to recommend you to a headache specialist, depending upon your symptoms or other physical conditions. Having diabetes, allergies, etc., may require a more comprehensive and inclusive approach to your headache. The National Headache Foundation will provide, upon request, a list of [facility] physician members in your state. Document Released: 02/04/2004 Document Revised: 02/06/2012 Document Reviewed: 07/14/2008 [facility] Patient Information 2014 [place], [place].   Blood-thinning medicines that you take. These drugs can lead to bleeding or bruising after an injection. They include:  Aspirin.  Ibuprofen.  Clopidogrel.  Warfarin.  Other medicines you take. This includes all vitamins, herbs, eyedrops, over-the-counter medicines, and creams.  Use of steroids.  Recent infections.  Past problems with numbing medicines.  Bleeding problems.  Surgeries you have had.  Other health problems. RISKS AND COMPLICATIONS A trigger point injection is a safe treatment.  However, problems may develop, such as:  Minor side effects usually go away in 1 to 2 days. These may include:  Soreness.  Bruising.  Stiffness.  More serious problems are rare. But, they may include:  Bleeding under the skin (hematoma).  Skin infection.  Breaking off of the needle under your skin.  Lung puncture.  The trigger point injection may not work for you. BEFORE THE PROCEDURE You may need to stop taking any medicine that thins your blood. This is to prevent bleeding and bruising. Usually these medicines are stopped several days before the injection. No other preparation is needed. PROCEDURE  A trigger point injection can be given in your caregiver's office or in a clinic. Each injection takes 2 minutes or less.  Your caregiver will feel for trigger points. The caregiver may use a marker to circle the area for the injection.  The skin over the trigger point will be washed with a germ-killing (antiseptic) solution.  The caregiver pinches the spot for the injection.  Then, a very thin needle is used for the shot. You may feel pain or a twitching feeling when the needle enters the trigger point.  A numbing solution may be injected into the trigger point. Sometimes a drug to keep down swelling, redness, and warmth (inflammation) is also injected.  Your caregiver moves the needle around the trigger zone until the tightness and twitching goes away.  After the injection, your caregiver may put gentle pressure over the injection site.  Then it is covered with a bandage. AFTER THE PROCEDURE  You can go right home after the injection.  The bandage can be taken off after a few hours.  You may feel sore and stiff for 1 to 2 days.  Go back to your regular activities slowly. Your caregiver may ask you to stretch your muscles. Do not do anything that takes extra energy for a few days.  Follow your caregiver's instructions to manage and treat other pain. Document  Released: 11/03/2011 Document Revised: 03/11/2013 Document Reviewed: 11/03/2011 [facility] Patient Information 2014 [place], [place].

## 2014-01-22 NOTE — ED Notes (Signed)
migrain straed at 0945 today she has taken her meds but they have not helped states she bent down to picke something off floor and  Felt like she was hit in head . ( BUT did NOT hit her head just felt like it)  [facility]

## 2014-01-22 NOTE — ED Provider Notes (Signed)
CSN: [id number]     Arrival date & time 01/22/14  1321 History   First MD Initiated Contact with Patient 01/22/14 1523     Chief Complaint  Patient presents with  . Migraine     (Consider location/radiation/quality/duration/timing/severity/associated sxs/prior Treatment) HPI  43 year old female with headache. Patient was sitting at her testing here at approximately 10:00 this morning she noticed small flashing lights in both her eyes. Shortly later she bent down to pick something been having increasing pain in the base of her head, her right neck right side of her head. Patient has a past history of what she calls migraines. Headaches similar in character to previous headaches but stronger in intensity. No known history of aneurysm. No known family history of aneurysm. Denies trauma. Visual changes have resolved. Other associated symptoms include photophobia, phonophobia and nausea. She's had these symptoms with prior headaches. No fevers or chills. No acute numbness, tingling or loss of strength. Took 2 doses of Midrin without any significant change. Was evaluated at urgent care prior to arrival referred to the emergency room apparently because she had some pupillary changes her previous provider was concerned about? Patient's not sure of what specifically the concern was though.  Past Medical History  Diagnosis Date  . Hypothyroidism   . Headache(784.0)   . Seizures     had sz as child and when they went away she started to have migrains   Past Surgical History  Procedure Laterality Date  . Cesarean section      x3   No family history on file. History  Substance Use Topics  . Smoking status: Never Smoker   . Smokeless tobacco: Not on file  . Alcohol Use: Yes     Comment: occasionally   OB History   Grav Para Term Preterm Abortions TAB SAB Ect Mult Living   4 3 3  0 1 0 1 0 0 3     Review of Systems  All systems reviewed and negative, other than as noted in  HPI.   Allergies  Percocet; Sumatriptan; Amoxicillin-pot clavulanate; Erythromycin; Latex; and Migranal  Home Medications   Current Outpatient Rx  Name  Route  Sig  Dispense  Refill  . Amino Acids (ESSENTIAL AMINO ACID MIX PO)   Oral   Take 1 tablet by mouth daily.         Marland Kitchen. b complex vitamins tablet   Oral   Take 1 tablet by mouth daily. Takes sporatically         . BIOTIN PO   Oral   Take 1 tablet by mouth daily.         . Cholecalciferol (VITAMIN D PO)   Oral   Take 1 capsule by mouth daily. Takes sporatically         . GLUCOSAMINE PO   Oral   Take 1 capsule by mouth daily.         Marland Kitchen. ibuprofen (ADVIL,MOTRIN) 200 MG tablet   Oral   Take 600 mg by mouth every 6 (six) hours as needed for headache.         . isometheptene-acetaminophen-dichloralphenazone (MIDRIN) 65-325-100 MG capsule   Oral   Take 2 capsules by mouth 4 (four) times daily as needed for migraine. Maximum 5 capsules in 12 hours for migraine headaches, 8 capsules in 24 hours for tension headaches.         Marland Kitchen. KRILL OIL PO   Oral   Take 1 capsule by mouth daily.         .Marland Kitchen  levothyroxine (SYNTHROID, LEVOTHROID) 75 MCG tablet   Oral   Take 75 mcg by mouth daily.         Marland Kitchen MAGNESIUM-POTASSIUM PO   Oral   Take 1 tablet by mouth daily. Takes sporatically         . Multiple Vitamin (MULTIVITAMIN WITH MINERALS) TABS   Oral   Take 1 tablet by mouth daily.         Marland Kitchen OVER THE COUNTER MEDICATION   Oral   Take 1 capsule by mouth daily. Daily Stress Formula         . tiZANidine (ZANAFLEX) 4 MG tablet   Oral   Take 4 mg by mouth every 6 (six) hours as needed for muscle spasms.         Marland Kitchen zonisamide (ZONEGRAN) 100 MG capsule   Oral   Take 400 mg by mouth daily.          BP 116/78  Pulse 58  Temp(Src) 98.7 F (37.1 C)  Resp 16  Wt 109 lb (49.442 kg)  SpO2 100% Physical Exam  Nursing note and vitals reviewed. Constitutional: She is oriented to person, place, and time. She  appears well-developed and well-nourished. No distress.  HENT:  Head: Normocephalic and atraumatic.  Palpable temporal arteries bilaterally with no apparent tenderness.  Eyes: Conjunctivae are normal. Pupils are equal, round, and reactive to light. Right eye exhibits no discharge. Left eye exhibits no discharge.  Neck: Neck supple.  Some tenderness to palpation along the right paraspinal musculature and cervical region. No midline spinal tenderness. No concerning skin lesions. No nuchal rigidity.  Cardiovascular: Normal rate, regular rhythm and normal heart sounds.  Exam reveals no gallop and no friction rub.   No murmur heard. Pulmonary/Chest: Effort normal and breath sounds normal. No respiratory distress.  Abdominal: Soft. She exhibits no distension. There is no tenderness.  Musculoskeletal: She exhibits no edema and no tenderness.  Neurological: She is alert and oriented to person, place, and time. No cranial nerve deficit. She exhibits normal muscle tone. Coordination normal.  Speech is clear. Content is appropriate. Good finger to nose testing bilaterally.  Skin: Skin is warm and dry.  Psychiatric: She has a normal mood and affect. Her behavior is normal. Thought content normal.    ED Course  Procedures (including critical care time)  Procedure Note: Trigger Point Injection (2 point) - Bilateral Lower Paracervical Muscular Injection 5:59 PM  Indication: Headache  Consent: Informed verbal consent obtained. Detailed procedure including risks/benefits including but not limited to:    Soreness.  Bruising.  Stiffness.  More serious problems are rare. But, they may include:  Bleeding under the skin (hematoma).  Skin infection.  Breaking off of the needle under your skin.  Lung puncture.  The trigger point injection may not work for you.  Contraindications: none noted  Positioning: seated upright  Procedure: Area was cleaned with alcohol. B/l paracervical musculature  at C 6/7 level was injected using C7 spinous process as landmark.  3cc total of 0.5% bupavicaine without epinephrine injected with 1.5 inch 25 g needle after using ethyl chloride spray and using distracting digital pressure.  Needle directed slightly cephalad from parallel to floor. Aspirated prior to injection.  Pt tolerated well with no apparent immediate complication.   Labs Review Labs Reviewed - No data to display Imaging Review No results found.  EKG Interpretation   None       MDM   Final diagnoses:  Headache   Suspect primary  HA. Consider emergent secondary causes such as bleed, infectious or mass but doubt. There is no history of trauma. Pt has a nonfocal neurological exam. Afebrile. No use of blood thinning medication. Consider ocular etiology such as acute angle closure glaucoma but doubt. Pt denies acute change in visual acuity. I'm not sure about the eye findings her previous provider was reportedly questioning. I find her eye exam to be unremarkable. Doubt temporal arteritis given age, no temporal tenderness and temporal artery pulsations palpable. Doubt CO poisoning. No contacts with similar symptoms. Doubt venous thrombosis. Doubt carotid or vertebral arteries dissection. Symptoms improved with meds. Feel that can be safely discharged, but strict return precautions discussed. Outpt fu.     Raeford Razor, MD 01/22/14 [phone]

## 2014-09-29 ENCOUNTER — Encounter (HOSPITAL_COMMUNITY): Payer: Self-pay | Admitting: Emergency Medicine

## 2014-10-11 ENCOUNTER — Ambulatory Visit (INDEPENDENT_AMBULATORY_CARE_PROVIDER_SITE_OTHER): Payer: BC Managed Care – PPO | Admitting: Family Medicine

## 2014-10-11 VITALS — BP 100/66 | HR 102 | Temp 98.6°F | Resp 24 | Ht 60.5 in | Wt 108.1 lb

## 2014-10-11 DIAGNOSIS — J209 Acute bronchitis, unspecified: Secondary | ICD-10-CM

## 2014-10-11 DIAGNOSIS — H65191 Other acute nonsuppurative otitis media, right ear: Secondary | ICD-10-CM

## 2014-10-11 MED ORDER — HYDROCOD POLST-CHLORPHEN POLST 10-8 MG/5ML PO LQCR: 5.0000 mL | Freq: Two times a day (BID) | ORAL | Status: DC | PRN

## 2014-10-11 MED ORDER — AZITHROMYCIN 250 MG PO TABS: ORAL_TABLET | ORAL | Status: DC

## 2014-10-11 MED ORDER — PREDNISONE 20 MG PO TABS: 40.0000 mg | ORAL_TABLET | Freq: Every day | ORAL | Status: DC

## 2014-10-11 NOTE — Progress Notes (Signed)
Subjective:    Patient ID: Laurie Oneal, female    DOB: August 13, 1971, 43 y.o.   MRN: [id number]  HPI Chief Complaint  Patient presents with  . Cough    x 2 days  . Fever  . Hoarse  . Ear Pain   This chart was scribed for Laurie SidleKurt Adalynn Corne, MD by Andrew Auaven Small, ED Scribe. This patient was seen in room 5 and the patient's care was started at 2:39 PM.  HPI Comments: Laurie Oneal is a 43 y.o. female who presents to the [facility] complaining of productive cough onset 2 days. Pt reports fever of 100 1 day ago and 102 this morning but took 3 ibuprofen. She also reports associated right otalgia, hoarseness, and right sided chest pressure. She reports exposure to symptoms from her boyfriends mother.   Pt is a International aid/development workerveterinarian.   Past Medical History  Diagnosis Date  . Hypothyroidism   . Headache(784.0)   . Seizures     had sz as child and when they went away she started to have migrains   Allergies  Allergen Reactions  . Percocet [Oxycodone-Acetaminophen] Nausea And Vomiting and Other (See Comments)    migraine  . Sumatriptan     *feels like someone pouring acid on skull*  . Amoxicillin-Pot Clavulanate Hives  . Erythromycin Other (See Comments)    Severe GI pains  . Latex   . Migranal [Dihydroergotamine] Nausea And Vomiting    Uncontrollable vomiting   Prior to Admission medications   Medication Sig Start Date End Date Taking? Authorizing Provider  Amino Acids (ESSENTIAL AMINO ACID MIX PO) Take 1 tablet by mouth daily.   Yes Historical Provider, MD  b complex vitamins tablet Take 1 tablet by mouth daily. Takes sporatically   Yes Historical Provider, MD  GLUCOSAMINE PO Take 1 capsule by mouth daily.   Yes Historical Provider, MD  ibuprofen (ADVIL,MOTRIN) 200 MG tablet Take 600 mg by mouth every 6 (six) hours as needed for headache.   Yes Historical Provider, MD  isometheptene-acetaminophen-dichloralphenazone (MIDRIN) 65-325-100 MG capsule Take 2 capsules  by mouth 4 (four) times daily as needed for migraine. Maximum 5 capsules in 12 hours for migraine headaches, 8 capsules in 24 hours for tension headaches.   Yes Historical Provider, MD  levothyroxine (SYNTHROID, LEVOTHROID) 75 MCG tablet Take 75 mcg by mouth daily.   Yes Historical Provider, MD  MAGNESIUM-POTASSIUM PO Take 1 tablet by mouth daily. Takes sporatically   Yes Historical Provider, MD  Multiple Vitamin (MULTIVITAMIN WITH MINERALS) TABS Take 1 tablet by mouth daily.   Yes Historical Provider, MD  OVER THE COUNTER MEDICATION Take 1 capsule by mouth daily. Daily Stress Formula   Yes Historical Provider, MD  tiZANidine (ZANAFLEX) 4 MG tablet Take 4 mg by mouth every 6 (six) hours as needed for muscle spasms.   Yes Historical Provider, MD  zonisamide (ZONEGRAN) 100 MG capsule Take 400 mg by mouth daily.   Yes Historical Provider, MD   Review of Systems  Constitutional: Positive for fever and chills.  HENT: Positive for ear pain and voice change.   Respiratory: Positive for cough.    Objective:   Physical Exam  Constitutional: She is oriented to person, place, and time. She appears well-developed and well-nourished. No distress.  HENT:  Head: Normocephalic and atraumatic.  Right Ear: Tympanic membrane is bulging (slightly).  Mouth/Throat: Posterior oropharyngeal erythema present.  Pt is hoarse  Eyes: Conjunctivae and EOM are normal.  Neck: Neck  supple.  Cardiovascular: Normal rate.   Pulmonary/Chest: Effort normal. She has wheezes ( expiratory). She has rales ( at the base bilaterally).  Musculoskeletal: Normal range of motion.  Neurological: She is alert and oriented to person, place, and time.  Skin: Skin is warm and dry.  Psychiatric: She has a normal mood and affect. Her behavior is normal.  Nursing note and vitals reviewed. hoarse speech Assessment & Plan:   1. Acute bronchitis, unspecified organism   2. Acute nonsuppurative otitis media of right ear    Meds ordered this  encounter  Medications  . chlorpheniramine-HYDROcodone (TUSSIONEX PENNKINETIC ER) 10-8 MG/5ML LQCR    Sig: Take 5 mLs by mouth every 12 (twelve) hours as needed.    Dispense:  115 mL    Refill:  0  . azithromycin (ZITHROMAX Z-PAK) 250 MG tablet    Sig: Take as directed on pack    Dispense:  6 tablet    Refill:  0  . predniSONE (DELTASONE) 20 MG tablet    Sig: Take 2 tablets (40 mg total) by mouth daily.    Dispense:  10 tablet    Refill:  0     I personally performed the services described in this documentation, which was scribed in my presence. The recorded information has been reviewed and is accurate.  Laurie SidleKurt Corin Formisano, MD

## 2014-10-11 NOTE — Patient Instructions (Signed)
Otitis Media With Effusion Otitis media with effusion is the presence of fluid in the middle ear. This is a common problem in children, which often follows ear infections. It may be present for weeks or longer after the infection. Unlike an acute ear infection, otitis media with effusion refers only to fluid behind the ear drum and not infection. Children with repeated ear and sinus infections and allergy problems are the most likely to get otitis media with effusion. CAUSES  The most frequent cause of the fluid buildup is dysfunction of the eustachian tubes. These are the tubes that drain fluid in the ears to the back of the nose (nasopharynx). SYMPTOMS   The main symptom of this condition is hearing loss. As a result, you or your child may:  Listen to the TV at a loud volume.  Not respond to questions.  Ask "what" often when spoken to.  Mistake or confuse one sound or word for another.  There may be a sensation of fullness or pressure but usually not pain. DIAGNOSIS   Your health care provider will diagnose this condition by examining you or your child's ears.  Your health care provider may test the pressure in you or your child's ear with a tympanometer.  A hearing test may be conducted if the problem persists. TREATMENT   Treatment depends on the duration and the effects of the effusion.  Antibiotics, decongestants, nose drops, and cortisone-type drugs (tablets or nasal spray) may not be helpful.  Children with persistent ear effusions may have delayed language or behavioral problems. Children at risk for developmental delays in hearing, learning, and speech may require referral to a specialist earlier than children not at risk.  You or your child's health care provider may suggest a referral to an ear, nose, and throat surgeon for treatment. The following may help restore normal hearing:  Drainage of fluid.  Placement of ear tubes (tympanostomy tubes).  Removal of adenoids  (adenoidectomy). HOME CARE INSTRUCTIONS   Avoid secondhand smoke.  Infants who are breastfed are less likely to have this condition.  Avoid feeding infants while they are lying flat.  Avoid known environmental allergens.  Avoid people who are sick. SEEK MEDICAL CARE IF:   Hearing is not better in 3 months.  Hearing is worse.  Ear pain.  Drainage from the ear.  Dizziness. MAKE SURE YOU:   Understand these instructions.  Will watch your condition.  Will get help right away if you are not doing well or get worse. Document Released: 12/22/2004 Document Revised: 03/31/2014 Document Reviewed: 06/11/2013 ExitCare Patient Information 2015 [employer]. This information is not intended to replace advice given to you by your health care provider. Make sure you discuss any questions you have with your health care provider. Acute Bronchitis Bronchitis is inflammation of the airways that extend from the windpipe into the lungs (bronchi). The inflammation often causes mucus to develop. This leads to a cough, which is the most common symptom of bronchitis.  In acute bronchitis, the condition usually develops suddenly and goes away over time, usually in a couple weeks. Smoking, allergies, and asthma can make bronchitis worse. Repeated episodes of bronchitis may cause further lung problems.  CAUSES Acute bronchitis is most often caused by the same virus that causes a cold. The virus can spread from person to person (contagious) through coughing, sneezing, and touching contaminated objects. SIGNS AND SYMPTOMS   Cough.   Fever.   Coughing up mucus.   Body aches.     Chest congestion.   Chills.   Shortness of breath.   Sore throat.  DIAGNOSIS  Acute bronchitis is usually diagnosed through a physical exam. Your health care provider will also ask you questions about your medical history. Tests, such as chest X-rays, are sometimes done to rule out other conditions.    TREATMENT  Acute bronchitis usually goes away in a couple weeks. Oftentimes, no medical treatment is necessary. Medicines are sometimes given for relief of fever or cough. Antibiotic medicines are usually not needed but may be prescribed in certain situations. In some cases, an inhaler may be recommended to help reduce shortness of breath and control the cough. A cool mist vaporizer may also be used to help thin bronchial secretions and make it easier to clear the chest.  HOME CARE INSTRUCTIONS  Get plenty of rest.   Drink enough fluids to keep your urine clear or pale yellow (unless you have a medical condition that requires fluid restriction). Increasing fluids may help thin your respiratory secretions (sputum) and reduce chest congestion, and it will prevent dehydration.   Take medicines only as directed by your health care provider.  If you were prescribed an antibiotic medicine, finish it all even if you start to feel better.  Avoid smoking and secondhand smoke. Exposure to cigarette smoke or irritating chemicals will make bronchitis worse. If you are a smoker, consider using nicotine gum or skin patches to help control withdrawal symptoms. Quitting smoking will help your lungs heal faster.   Reduce the chances of another bout of acute bronchitis by washing your hands frequently, avoiding people with cold symptoms, and trying not to touch your hands to your mouth, nose, or eyes.   Keep all follow-up visits as directed by your health care provider.  SEEK MEDICAL CARE IF: Your symptoms do not improve after 1 week of treatment.  SEEK IMMEDIATE MEDICAL CARE IF:  You develop an increased fever or chills.   You have chest pain.   You have severe shortness of breath.  You have bloody sputum.   You develop dehydration.  You faint or repeatedly feel like you are going to pass out.  You develop repeated vomiting.  You develop a severe headache. MAKE SURE YOU:   Understand  these instructions.  Will watch your condition.  Will get help right away if you are not doing well or get worse. Document Released: 12/22/2004 Document Revised: 03/31/2014 Document Reviewed: 05/07/2013 ExitCare Patient Information 2015 [employer]. This information is not intended to replace advice given to you by your health care provider. Make sure you discuss any questions you have with your health care provider.  

## 2014-10-15 ENCOUNTER — Other Ambulatory Visit: Payer: Self-pay | Admitting: Family Medicine

## 2016-04-18 ENCOUNTER — Other Ambulatory Visit (INDEPENDENT_AMBULATORY_CARE_PROVIDER_SITE_OTHER): Payer: Self-pay | Admitting: Otolaryngology

## 2016-04-18 DIAGNOSIS — J329 Chronic sinusitis, unspecified: Secondary | ICD-10-CM

## 2016-04-22 ENCOUNTER — Other Ambulatory Visit: Payer: Self-pay

## 2016-07-20 ENCOUNTER — Other Ambulatory Visit: Payer: Self-pay | Admitting: Rheumatology

## 2016-07-20 DIAGNOSIS — M542 Cervicalgia: Secondary | ICD-10-CM

## 2016-07-20 DIAGNOSIS — R202 Paresthesia of skin: Secondary | ICD-10-CM

## 2016-08-10 ENCOUNTER — Other Ambulatory Visit: Payer: Self-pay | Admitting: Rheumatology

## 2016-08-17 ENCOUNTER — Ambulatory Visit
Admission: RE | Admit: 2016-08-17 | Discharge: 2016-08-17 | Disposition: A | Discharge status: Home / Self Care | Payer: BLUE CROSS/BLUE SHIELD | Source: Ambulatory Visit | Attending: Rheumatology | Admitting: Rheumatology

## 2016-08-17 DIAGNOSIS — M542 Cervicalgia: Secondary | ICD-10-CM

## 2016-08-17 DIAGNOSIS — R202 Paresthesia of skin: Secondary | ICD-10-CM

## 2016-10-07 ENCOUNTER — Ambulatory Visit (INDEPENDENT_AMBULATORY_CARE_PROVIDER_SITE_OTHER): Payer: BLUE CROSS/BLUE SHIELD | Admitting: Physical Medicine and Rehabilitation

## 2016-10-07 ENCOUNTER — Encounter (INDEPENDENT_AMBULATORY_CARE_PROVIDER_SITE_OTHER): Payer: Self-pay | Admitting: Physical Medicine and Rehabilitation

## 2016-10-07 VITALS — BP 125/73 | HR 67

## 2016-10-07 DIAGNOSIS — M542 Cervicalgia: Secondary | ICD-10-CM

## 2016-10-07 DIAGNOSIS — M609 Myositis, unspecified: Secondary | ICD-10-CM

## 2016-10-07 DIAGNOSIS — M79602 Pain in left arm: Secondary | ICD-10-CM | POA: Diagnosis not present

## 2016-10-07 DIAGNOSIS — R202 Paresthesia of skin: Secondary | ICD-10-CM | POA: Diagnosis not present

## 2016-10-07 NOTE — Progress Notes (Signed)
Laurie Oneal - 45 y.o. female MRN [id number]  Date of birth: 1971/03/05  Office Visit Note: Visit Date: 10/07/2016 PCP: No PCP Per Patient Referred by: Annia BeltHagen, Christine, MD  Subjective: Chief Complaint  Patient presents with  . Neck - Pain  . Left Shoulder - Pain, Numbness  . Left Hand - Pain, Numbness, Weakness   HPI: Laurie Oneal she is a 45 year old female that works as a Diplomatic Services operational officerfeline veterinarian who comes in today at the request of Dr. Catalina LungerHagan who treats her migraine headaches. She comes in today with a chronic history of neck pain that radiates into the bilateral shoulders.  This has been an ongoing problem and she has had several referrals that she is gone through to try to discover the source of her pain. Her left arm at least on one episode had shooting pain down the fingertips and she really felt like she was unable to move her arm. She does not have radicular complaints in a dermatomal fashion. She does feel like there is weakness but has not had focal weakness. She is also having pain in multiple areas including joints and ankles. Today having ankle pain. Pain in thighs, feels like bugs are crawling especially on backs of knees. Difficulty sleeping. Gabapentin prescribed for nosebleeds but has helped nerve pain. Numbness in arms. Episode of numbness in left leg. Episode of shooting pain in left arm and she could not move her arm. When she turns her head he neck crunches and causes headaches. She is very worried about the crunching. Again she does have a chronic history of migraines and is under the care of Dr. Dorothey BasemanHagans is a neurologist. She also gets some relief with change of position and she gets some changes daily and how her pain affects her. She does have a chronic history of insomnia and doesn't sleep well. She denies any significant morning stiffness but is tired in the morning. She has not noted any color changes in the hands or arms.   She was  referred to and saw Casimer LaniusGovinda Aryal, M.D.  who is a a [facility]. She had full panel of tests for rheumatologic disease and Lyme.  She was frustrated with the physician assistant whoevidently after cursory evaluation said she had fibromyalgia. She has [facility] and was attempting to see Dahari D. Shon BatonBrooks, M.D. but evidently he was called away that day and she did see his physician assistant. Evidently that was a bad experience for her and again she was quite frustrated at the time. Evidently the physician assistant looked at her current pictures and situation basically said that she should not be hurting. She was also seen by Dr. Ethelene Halamos but we do not have those notes. She has not had significant spinal procedures done per her report.   Neck Pain   This is a chronic problem. The current episode started more than 1 month ago. The pain is at a severity of 8/10. Associated symptoms include weakness. Pertinent negatives include no chest pain, fever or weight loss.   Review of Systems  Constitutional: Positive for malaise/fatigue. Negative for chills, fever and weight loss.  HENT: Negative for hearing loss and sinus pain.   Eyes: Negative for blurred vision and double vision.  Respiratory: Negative for cough and shortness of breath.   Cardiovascular: Negative for chest pain, palpitations and leg swelling.  Gastrointestinal: Negative for abdominal pain, nausea and vomiting.  Genitourinary: Negative for flank pain.  Musculoskeletal: Positive for  joint pain and neck pain. Negative for myalgias.  Skin: Negative for itching and rash.  Neurological: Positive for weakness. Negative for tremors and focal weakness.  Endo/Heme/Allergies: Negative.   Psychiatric/Behavioral: Negative for depression.  All other systems reviewed and are negative.  Otherwise per HPI.  Assessment & Plan: Visit Diagnoses:  1. Cervicalgia   2. Paresthesia of skin   3. Pain in left arm   4. Myofascitis     Plan:  Findings:  Chronic worsening neck pain and multisegment multi-joint pain. Her neck pain is multifactorial consistent mostly myofascial pain and likely exacerbated her modified by underlying fibromyalgia. I do feel like if you look at her case as a whole and her conditions as well as anxiety levels and insomnia and malaise. This is with a normal cervical MRI. She does seem to have fibromyalgia. We discussed this at length. She would be better served on that front by a rheumatologist who could at least follow her case and give her some answers going forward. In terms of her myofascial pain though she does have clear trigger points that are not related to the fibromyalgia. I think she would benefit from seeing a skilled physical therapist just for a short course particularly focusing on dry needling. I did give her a prescription today [facility] for specific dry needling. From a medication standpoint she has tried and failed most medications that he would use for fibromyalgia and nerve pain due to the fact that she has migraine headaches. She has been taking the gabapentin. She has tried Lyrica. I would like Dr. Dorothey BasemanHagans to take a look at her current medications for migraine headaches and if there is any overlap with those and possible fibromyalgia they could maximize the benefit of those. She has her much better than I do in terms of her tolerance to medications.  Lastly, although was not his biggest issue she could be having some focal nerve entrapment in the hands. She had some signs and symptoms consistent with possible carpal tunnel syndrome and we are going to get an electrodiagnostic study. We did set her up for that and I'll see her back for that. I spent more than 35 minutes speaking face-to-face with the patient with 50% of the time in counseling.    Meds & Orders: No orders of the defined types were placed in this encounter.  No orders of the defined types were  placed in this encounter.   Follow-up: Return for Schedule electrodiagnostic study of both upper limbs..   Procedures: No procedures performed  No notes on file   Clinical History: Cervical MRI dated 08/17/2016  COMPARISON: Cervical spine MRI 06/15/2009  IMPRESSION: Essentially normal for age cervical spine.  She reports that she has never smoked. She has never used smokeless tobacco. No results for input(s): HGBA1C, LABURIC in the last 8760 hours.  Objective:  VS:  HT:    WT:   BMI:     BP:125/73  HR:67bpm  TEMP: ( )  RESP:  Physical Exam  Constitutional: She is oriented to person, place, and time. She appears well-developed and well-nourished. No distress.  HENT:  Head: Normocephalic and atraumatic.  Nose: Nose normal.  Mouth/Throat: Oropharynx is clear and moist.  Eyes: Conjunctivae are normal. Pupils are equal, round, and reactive to light.  Neck: Normal range of motion. Neck supple.  Cardiovascular: Regular rhythm and intact distal pulses.   Pulmonary/Chest: Effort normal. She has no wheezes.  Abdominal: She exhibits no  distension.  Musculoskeletal:  The patient has pain with any real movement of any joint or extremity but in particularly the cervical spine range of motion is limited in all planes due to pain. She does have focal trigger point in the rhomboids on the left as well as the levator scapula. This does reproduce some of her pain. She has no impingement signs of the shoulders bilaterally. She has good strength in the hands and arms although there is somewhat poor effort due to pain. 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 She does not have any atrophy of the APB or FDI or hand intrinsic musculature. She has some impaired sensation but is nondermatomal on the left. She has a negative Tinel's bilaterally at the wrist. She has 2+ muscle stretch reflexes at the biceps and brachioradialis. She is a negative Hoffmann's test bilaterally.    Neurological: She is alert and oriented to person, place, and time. No cranial nerve deficit.  Skin: Skin is warm.  Psychiatric: She has a normal mood and affect. Her behavior is normal.  Nursing note and vitals reviewed.   Ortho Exam Imaging: No results found.  Past Medical/Family/Surgical/Social History: Medications & Allergies reviewed per EMR Patient Active Problem List   Diagnosis Date Noted  . GASTROENTERITIS 03/22/2010  . HYPOTHYROIDISM 12/10/2009  . INSOMNIA 12/01/2009  . MIGRAINE HEADACHE 06/11/2009  . NECK PAIN 06/10/2009  . ALLERGIC RHINITIS DUE TO OTHER ALLERGEN 12/27/2007  . SEIZURE DISORDER, HX OF 12/27/2007  . 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  . HEADACHE 08/28/2007   Past Medical History:  Diagnosis Date  . Headache(784.0)   . Hypothyroidism   . Seizures (HCC)    had sz as child and when they went away she started to have migrains   History reviewed. No pertinent family history. Past Surgical History:  Procedure Laterality Date  . CESAREAN SECTION     x3   Social History   Occupational History  . Not on file.   Social History Main Topics  . Smoking status: Never Smoker  . Smokeless tobacco: Never Used  . Alcohol use 0.0 oz/week     Comment: occasionally  . Drug use: No  . Sexual activity: Not on file

## 2016-10-10 ENCOUNTER — Encounter (INDEPENDENT_AMBULATORY_CARE_PROVIDER_SITE_OTHER): Payer: Self-pay | Admitting: Physical Medicine and Rehabilitation

## 2016-10-25 ENCOUNTER — Encounter (INDEPENDENT_AMBULATORY_CARE_PROVIDER_SITE_OTHER): Payer: Self-pay | Admitting: Physical Medicine and Rehabilitation

## 2016-10-25 ENCOUNTER — Ambulatory Visit (INDEPENDENT_AMBULATORY_CARE_PROVIDER_SITE_OTHER): Payer: BLUE CROSS/BLUE SHIELD | Admitting: Physical Medicine and Rehabilitation

## 2016-10-25 DIAGNOSIS — R202 Paresthesia of skin: Secondary | ICD-10-CM

## 2016-10-25 NOTE — Progress Notes (Signed)
Laurie Oneal - 45 y.o. female MRN [id number]  Date of birth: 12/27/1970  Office Visit Note: Visit Date: 10/25/2016 PCP: No PCP Per Patient Referred by: No ref. provider found  Subjective: Chief Complaint  Patient presents with  . Left Hand - Pain, Numbness, Weakness  . 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 She is here today for bilateral nerve studies. Having pain, numbness, tingling, and weakness in both hands. Worse on the left. The left hand numbness and dysesthesia is in the ulnar 2 digits. The right hand is more in the radial 3 digits. The right and also gets a sharp electrical pain randomly from the dorsal forearm into the hand. She has not had prior electrodiagnostic studies. Her symptoms have not really changed since the last time we saw her for evaluation.    ROS Otherwise per HPI.  Assessment & Plan: Visit Diagnoses:  1. Paresthesia of skin     Plan: Findings:  Chronic worsening neck and shoulder pain with pain into the arms and elbow specifically. She gets a lot of pain with any motion or touching. She does have tender points she also has trigger points. 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 Otherwise there were no specific findings on electrodiagnostic study. Nothing on today's tests would explain her symptoms on the left. I still feel like a lot of this is a combination of myofascial pain syndrome with trigger points and referral pattern which are more myotome pattern. I think she gets some tightness in the scalene musculature he gives her some dysesthesia in the ulnar digits on the left. A lot of her pain radiates up into the left jaw and ear which is pretty classic for trigger  points. I also feel like she does have fibromyalgia syndrome. She has seen a rheumatologist but may need to regroup with that. I have given her a prescription for physical therapy and she is 1 to start that for dry needling. She does attend acupuncture sessions and massage therapy already.    Meds & Orders: No orders of the defined types were placed in this encounter.   Orders Placed This Encounter  Procedures  . NCV with EMG (electromyography)    Follow-up: Return if symptoms worsen or fail to improve.   Procedures: No procedures performed  EMG & NCV Findings: Evaluation of the right median (across palm) sensory nerve showed prolonged distal peak latency (Wrist, 3.7 ms) and prolonged distal peak latency (Palm, 2.3 ms).  All remaining nerves (as indicated in the following tables) were within normal limits.  All left vs. right side differences were within normal limits.    All examined muscles (as indicated in the following table) showed no evidence of electrical instability.    Impression: The above electrodiagnostic study is ABNORMAL and reveals evidence of a mild right median nerve entrapment at the wrist (carpal tunnel syndrome) affecting sensory components. There is no significant electrodiagnostic evidence of any other focal nerve entrapment, brachial plexopathy or cervical radiculopathy.   Recommendations: 1.  Follow-up with referring physician. 2.  Continue current management of symptoms. Did give her instructions on using a night splint on the right hand.  Nerve Conduction Studies Anti Sensory Summary Table   Stim  Site NR Peak (ms) Norm Peak (ms) P-T Amp (V) Norm P-T Amp Site1 Site2 Delta-P (ms) Dist (cm) Vel (m/s) Norm Vel (m/s)  Left Median Acr Palm Anti Sensory (2nd Digit)  33.5C  Wrist    3.2 <3.6 57.2 >10 Wrist Palm 1.4 0.0    Palm    1.8 <2.0 62.2         Right Median Acr Palm Anti Sensory (2nd Digit)  31.2C  Wrist    *3.7 <3.6 18.8 >10 Wrist Palm 1.4 0.0    Palm     *2.3 <2.0 28.9         Left Radial Anti Sensory (Base 1st Digit)  34.5C  Wrist    2.7 <3.1 22.4  Wrist Base 1st Digit 2.7 0.0    Right Radial Anti Sensory (Base 1st Digit)  31.3C  Wrist    2.5 <3.1 40.6  Wrist Base 1st Digit 2.5 0.0    Left Ulnar Anti Sensory (5th Digit)  33.8C  Wrist    3.3 <3.7 37.0 >15.0 Wrist 5th Digit 3.3 14.0 42 >38  Right Ulnar Anti Sensory (5th Digit)  31.1C  Wrist    3.3 <3.7 41.0 >15.0 Wrist 5th Digit 3.3 14.0 42 >38   Motor Summary Table   Stim Site NR Onset (ms) Norm Onset (ms) O-P Amp (mV) Norm O-P Amp Site1 Site2 Delta-0 (ms) Dist (cm) Vel (m/s) Norm Vel (m/s)  Left Median Motor (Abd Poll Brev)  33.1C  Wrist    3.1 <4.2 8.9 >5 Elbow Wrist 3.7 20.0 54 >50  Elbow    6.8  10.0         Right Median Motor (Abd Poll Brev)  31.6C  Wrist    3.4 <4.2 5.8 >5 Elbow Wrist 3.8 20.2 53 >50  Elbow    7.2  6.6         Left Ulnar Motor (Abd Dig Min)  32.2C  Wrist    2.7 <4.2 12.1 >3 B Elbow Wrist 2.9 18.0 62 >53  B Elbow    5.6  11.6  A Elbow B Elbow 1.1 9.0 82 >53  A Elbow    6.7  11.6         Right Ulnar Motor (Abd Dig Min)  31.6C  Wrist    2.8 <4.2 12.1 >3 B Elbow Wrist 2.9 19.5 67 >53  B Elbow    5.7  12.1  A Elbow B Elbow 1.0 9.5 95 >53  A Elbow    6.7  11.8          EMG   Side Muscle Nerve Root Ins Act Fibs Psw Amp Dur Poly Recrt Int Dennie BiblePat Comment  Right Abd Poll Brev Median C8-T1 Nml Nml Nml Nml Nml 0 Nml Nml   Right 1stDorInt Ulnar C8-T1 Nml Nml Nml Nml Nml 0 Nml Nml   Right PronatorTeres Median C6-7 Nml Nml Nml Nml Nml 0 Nml Nml   Right Biceps Musculocut C5-6 Nml Nml Nml Nml Nml 0 Nml Nml   Right Deltoid Axillary C5-6 Nml Nml Nml Nml Nml 0 Nml Nml     Nerve Conduction Studies Anti Sensory Left/Right Comparison   Stim Site L Lat (ms) R Lat (ms) L-R Lat (ms) L Amp (V) R Amp (V) L-R Amp (%) Site1 Site2 L Vel (m/s) R Vel (m/s) L-R Vel (m/s)  Median Acr Palm Anti Sensory (2nd Digit)  33.5C  Wrist 3.2 *3.7 0.5 57.2 18.8 67.1 Wrist Applied MaterialsPalm     Palm  1.8 *2.3 0.5 62.2 28.9 53.5       Radial Anti Sensory (Base 1st Digit)  34.5C  Wrist 2.7 2.5 0.2 22.4 40.6 44.8 Wrist Base 1st Digit     Ulnar Anti Sensory (5th Digit)  33.8C  Wrist 3.3 3.3 0.0 37.0 41.0 9.8 Wrist 5th Digit 42 42 0   Motor Left/Right Comparison   Stim Site L Lat (ms) R Lat (ms) L-R Lat (ms) L Amp (mV) R Amp (mV) L-R Amp (%) Site1 Site2 L Vel (m/s) R Vel (m/s) L-R Vel (m/s)  Median Motor (Abd Poll Brev)  33.1C  Wrist 3.1 3.4 0.3 8.9 5.8 34.8 Elbow Wrist 54 53 1  Elbow 6.8 7.2 0.4 10.0 6.6 34.0       Ulnar Motor (Abd Dig Min)  32.2C  Wrist 2.7 2.8 0.1 12.1 12.1 0.0 B Elbow Wrist 62 67 5  B Elbow 5.6 5.7 0.1 11.6 12.1 4.1 A Elbow B Elbow 82 95 13  A Elbow 6.7 6.7 0.0 11.6 11.8 1.7             Clinical History: Cervical MRI dated 08/17/2016  COMPARISON: Cervical spine MRI 06/15/2009  IMPRESSION: Essentially normal for age cervical spine.  She reports that she has never smoked. She has never used smokeless tobacco. No results for input(s): HGBA1C, LABURIC in the last 8760 hours.  Objective:  VS:  HT:    WT:   BMI:     BP:   HR: bpm  TEMP: ( )  RESP:  Physical Exam  Musculoskeletal:  Examination of the bilateral hand shows no specific atrophy of the FDI intrinsic hand muscles or APB bilaterally. She has a negative Tinel's at the wrists bilaterally. She has impaired sensation to light touch which is more of a dysesthesia particularly on the left ulnar digits. She has good strength in the hands bilaterally. This includes wrist extension and finger flexion and APB and finger abduction. She does have some osteoarthritic changes of the joints. There is no synovitis present.    Ortho Exam Imaging: No results found.  Past Medical/Family/Surgical/Social History: Medications & Allergies reviewed per EMR Patient Active Problem List   Diagnosis Date Noted  . GASTROENTERITIS 03/22/2010  . HYPOTHYROIDISM 12/10/2009  . INSOMNIA 12/01/2009  . MIGRAINE HEADACHE  06/11/2009  . NECK PAIN 06/10/2009  . ALLERGIC RHINITIS DUE TO OTHER ALLERGEN 12/27/2007  . SEIZURE DISORDER, HX OF 12/27/2007  . 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  . HEADACHE 08/28/2007   Past Medical History:  Diagnosis Date  . Headache(784.0)   . Hypothyroidism   . Seizures (HCC)    had sz as child and when they went away she started to have migrains   History reviewed. No pertinent family history. Past Surgical History:  Procedure Laterality Date  . CESAREAN SECTION     x3   Social History   Occupational History  . Not on file.   Social History Main Topics  . Smoking status: Never Smoker  . Smokeless tobacco: Never Used  . Alcohol use 0.0 oz/week     Comment: occasionally  . Drug use: No  . Sexual activity: Not on file

## 2016-10-26 NOTE — Procedures (Signed)
EMG & NCV Findings: Evaluation of the right median (across palm) sensory nerve showed prolonged distal peak latency (Wrist, 3.7 ms) and prolonged distal peak latency (Palm, 2.3 ms).  All remaining nerves (as indicated in the following tables) were within normal limits.  All left vs. right side differences were within normal limits.    All examined muscles (as indicated in the following table) showed no evidence of electrical instability.    Impression: The above electrodiagnostic study is ABNORMAL and reveals evidence of a mild right median nerve entrapment at the wrist (carpal tunnel syndrome) affecting sensory components. There is no significant electrodiagnostic evidence of any other focal nerve entrapment, brachial plexopathy or cervical radiculopathy.   Recommendations: 1.  Follow-up with referring physician. 2.  Continue current management of symptoms. Did give her instructions on using a night splint on the right hand.  Nerve Conduction Studies Anti Sensory Summary Table   Stim Site NR Peak (ms) Norm Peak (ms) P-T Amp (V) Norm P-T Amp Site1 Site2 Delta-P (ms) Dist (cm) Vel (m/s) Norm Vel (m/s)  Left Median Acr Palm Anti Sensory (2nd Digit)  33.5C  Wrist    3.2 <3.6 57.2 >10 Wrist Palm 1.4 0.0    Palm    1.8 <2.0 62.2         Right Median Acr Palm Anti Sensory (2nd Digit)  31.2C  Wrist    *3.7 <3.6 18.8 >10 Wrist Palm 1.4 0.0    Palm    *2.3 <2.0 28.9         Left Radial Anti Sensory (Base 1st Digit)  34.5C  Wrist    2.7 <3.1 22.4  Wrist Base 1st Digit 2.7 0.0    Right Radial Anti Sensory (Base 1st Digit)  31.3C  Wrist    2.5 <3.1 40.6  Wrist Base 1st Digit 2.5 0.0    Left Ulnar Anti Sensory (5th Digit)  33.8C  Wrist    3.3 <3.7 37.0 >15.0 Wrist 5th Digit 3.3 14.0 42 >38  Right Ulnar Anti Sensory (5th Digit)  31.1C  Wrist    3.3 <3.7 41.0 >15.0 Wrist 5th Digit 3.3 14.0 42 >38   Motor Summary Table   Stim Site NR Onset (ms) Norm Onset (ms) O-P Amp (mV) Norm O-P Amp Site1  Site2 Delta-0 (ms) Dist (cm) Vel (m/s) Norm Vel (m/s)  Left Median Motor (Abd Poll Brev)  33.1C  Wrist    3.1 <4.2 8.9 >5 Elbow Wrist 3.7 20.0 54 >50  Elbow    6.8  10.0         Right Median Motor (Abd Poll Brev)  31.6C  Wrist    3.4 <4.2 5.8 >5 Elbow Wrist 3.8 20.2 53 >50  Elbow    7.2  6.6         Left Ulnar Motor (Abd Dig Min)  32.2C  Wrist    2.7 <4.2 12.1 >3 B Elbow Wrist 2.9 18.0 62 >53  B Elbow    5.6  11.6  A Elbow B Elbow 1.1 9.0 82 >53  A Elbow    6.7  11.6         Right Ulnar Motor (Abd Dig Min)  31.6C  Wrist    2.8 <4.2 12.1 >3 B Elbow Wrist 2.9 19.5 67 >53  B Elbow    5.7  12.1  A Elbow B Elbow 1.0 9.5 95 >53  A Elbow    6.7  11.8          EMG  Side Muscle Nerve Root Ins Act Fibs Psw Amp Dur Poly Recrt Int Dennie BiblePat Comment  Right Abd Poll Brev Median C8-T1 Nml Nml Nml Nml Nml 0 Nml Nml   Right 1stDorInt Ulnar C8-T1 Nml Nml Nml Nml Nml 0 Nml Nml   Right PronatorTeres Median C6-7 Nml Nml Nml Nml Nml 0 Nml Nml   Right Biceps Musculocut C5-6 Nml Nml Nml Nml Nml 0 Nml Nml   Right Deltoid Axillary C5-6 Nml Nml Nml Nml Nml 0 Nml Nml     Nerve Conduction Studies Anti Sensory Left/Right Comparison   Stim Site L Lat (ms) R Lat (ms) L-R Lat (ms) L Amp (V) R Amp (V) L-R Amp (%) Site1 Site2 L Vel (m/s) R Vel (m/s) L-R Vel (m/s)  Median Acr Palm Anti Sensory (2nd Digit)  33.5C  Wrist 3.2 *3.7 0.5 57.2 18.8 67.1 Wrist Palm     Palm 1.8 *2.3 0.5 62.2 28.9 53.5       Radial Anti Sensory (Base 1st Digit)  34.5C  Wrist 2.7 2.5 0.2 22.4 40.6 44.8 Wrist Base 1st Digit     Ulnar Anti Sensory (5th Digit)  33.8C  Wrist 3.3 3.3 0.0 37.0 41.0 9.8 Wrist 5th Digit 42 42 0   Motor Left/Right Comparison   Stim Site L Lat (ms) R Lat (ms) L-R Lat (ms) L Amp (mV) R Amp (mV) L-R Amp (%) Site1 Site2 L Vel (m/s) R Vel (m/s) L-R Vel (m/s)  Median Motor (Abd Poll Brev)  33.1C  Wrist 3.1 3.4 0.3 8.9 5.8 34.8 Elbow Wrist 54 53 1  Elbow 6.8 7.2 0.4 10.0 6.6 34.0       Ulnar Motor (Abd Dig Min)   32.2C  Wrist 2.7 2.8 0.1 12.1 12.1 0.0 B Elbow Wrist 62 67 5  B Elbow 5.6 5.7 0.1 11.6 12.1 4.1 A Elbow B Elbow 82 95 13  A Elbow 6.7 6.7 0.0 11.6 11.8 1.7

## 2016-10-26 NOTE — Progress Notes (Signed)
Faxed

## 2016-11-28 DIAGNOSIS — M797 Fibromyalgia: Secondary | ICD-10-CM

## 2016-11-28 HISTORY — DX: Fibromyalgia: M79.7

## 2017-06-08 ENCOUNTER — Telehealth (INDEPENDENT_AMBULATORY_CARE_PROVIDER_SITE_OTHER): Payer: Self-pay | Admitting: Physical Medicine and Rehabilitation

## 2017-06-09 ENCOUNTER — Other Ambulatory Visit (INDEPENDENT_AMBULATORY_CARE_PROVIDER_SITE_OTHER): Payer: Self-pay | Admitting: Physical Medicine and Rehabilitation

## 2017-06-09 MED ORDER — GABAPENTIN 300 MG PO CAPS: 300.0000 mg | ORAL_CAPSULE | Freq: Two times a day (BID) | ORAL | 1 refills | Status: DC

## 2017-06-09 NOTE — Telephone Encounter (Signed)
See my note from today but I did refill the gabapentin. I did refill this to the pharmacy of record.

## 2017-06-09 NOTE — Telephone Encounter (Signed)
Based on my last note with the patient she was tolerating gabapentin. I think she could take this regularly at twice a day and see how she tolerates it and it seems like this what she is doing per the phone call that we received. I did refill the gabapentin. Otherwise I think her care really is going to be better managed by a rheumatologist that she has seen in the past. I think most of her pain is from fibromyalgia. She'll continue to follow with Dr. Catalina LungerHagan for her headaches.

## 2017-06-14 NOTE — Telephone Encounter (Signed)
Called patient and left message advising that prescription has been sent and to follow up with rheumatology.

## 2017-09-11 NOTE — Telephone Encounter (Signed)
Duplicate request

## 2018-02-05 DIAGNOSIS — M797 Fibromyalgia: Secondary | ICD-10-CM | POA: Diagnosis not present

## 2018-02-05 DIAGNOSIS — G4700 Insomnia, unspecified: Secondary | ICD-10-CM | POA: Diagnosis not present

## 2018-02-06 DIAGNOSIS — Z6822 Body mass index (BMI) 22.0-22.9, adult: Secondary | ICD-10-CM | POA: Diagnosis not present

## 2018-02-06 DIAGNOSIS — Z01419 Encounter for gynecological examination (general) (routine) without abnormal findings: Secondary | ICD-10-CM | POA: Diagnosis not present

## 2018-02-06 DIAGNOSIS — N870 Mild cervical dysplasia: Secondary | ICD-10-CM | POA: Diagnosis not present

## 2018-02-06 DIAGNOSIS — N915 Oligomenorrhea, unspecified: Secondary | ICD-10-CM | POA: Diagnosis not present

## 2018-02-26 DIAGNOSIS — R87612 Low grade squamous intraepithelial lesion on cytologic smear of cervix (LGSIL): Secondary | ICD-10-CM | POA: Diagnosis not present

## 2018-02-28 DIAGNOSIS — G43109 Migraine with aura, not intractable, without status migrainosus: Secondary | ICD-10-CM | POA: Diagnosis not present

## 2018-02-28 DIAGNOSIS — M542 Cervicalgia: Secondary | ICD-10-CM | POA: Diagnosis not present

## 2018-03-13 DIAGNOSIS — G43709 Chronic migraine without aura, not intractable, without status migrainosus: Secondary | ICD-10-CM | POA: Diagnosis not present

## 2018-04-11 DIAGNOSIS — G43709 Chronic migraine without aura, not intractable, without status migrainosus: Secondary | ICD-10-CM | POA: Diagnosis not present

## 2018-06-29 DIAGNOSIS — G43709 Chronic migraine without aura, not intractable, without status migrainosus: Secondary | ICD-10-CM | POA: Diagnosis not present

## 2018-07-04 DIAGNOSIS — G43709 Chronic migraine without aura, not intractable, without status migrainosus: Secondary | ICD-10-CM | POA: Diagnosis not present

## 2018-07-27 DIAGNOSIS — Z136 Encounter for screening for cardiovascular disorders: Secondary | ICD-10-CM | POA: Diagnosis not present

## 2018-07-27 DIAGNOSIS — R109 Unspecified abdominal pain: Secondary | ICD-10-CM | POA: Diagnosis not present

## 2018-07-27 DIAGNOSIS — Z0000 Encounter for general adult medical examination without abnormal findings: Secondary | ICD-10-CM | POA: Diagnosis not present

## 2018-08-21 DIAGNOSIS — H903 Sensorineural hearing loss, bilateral: Secondary | ICD-10-CM | POA: Diagnosis not present

## 2018-08-21 DIAGNOSIS — H6983 Other specified disorders of Eustachian tube, bilateral: Secondary | ICD-10-CM | POA: Diagnosis not present

## 2018-08-24 ENCOUNTER — Other Ambulatory Visit: Payer: Self-pay | Admitting: Physician Assistant

## 2018-08-24 DIAGNOSIS — K5900 Constipation, unspecified: Secondary | ICD-10-CM | POA: Diagnosis not present

## 2018-08-24 DIAGNOSIS — Z1211 Encounter for screening for malignant neoplasm of colon: Secondary | ICD-10-CM | POA: Diagnosis not present

## 2018-08-24 DIAGNOSIS — R1031 Right lower quadrant pain: Secondary | ICD-10-CM

## 2018-08-24 DIAGNOSIS — R1011 Right upper quadrant pain: Secondary | ICD-10-CM | POA: Diagnosis not present

## 2018-09-07 ENCOUNTER — Ambulatory Visit
Admission: RE | Admit: 2018-09-07 | Discharge: 2018-09-07 | Disposition: A | Discharge status: Home / Self Care | Payer: 59 | Source: Ambulatory Visit | Attending: Physician Assistant | Admitting: Physician Assistant

## 2018-09-07 DIAGNOSIS — R1011 Right upper quadrant pain: Secondary | ICD-10-CM

## 2018-09-07 DIAGNOSIS — R1031 Right lower quadrant pain: Secondary | ICD-10-CM

## 2018-09-07 DIAGNOSIS — K7689 Other specified diseases of liver: Secondary | ICD-10-CM | POA: Diagnosis not present

## 2018-09-13 ENCOUNTER — Other Ambulatory Visit: Payer: Self-pay | Admitting: Physician Assistant

## 2018-09-13 DIAGNOSIS — N281 Cyst of kidney, acquired: Secondary | ICD-10-CM

## 2018-09-13 DIAGNOSIS — R1031 Right lower quadrant pain: Secondary | ICD-10-CM

## 2018-09-13 DIAGNOSIS — K838 Other specified diseases of biliary tract: Secondary | ICD-10-CM

## 2018-09-13 DIAGNOSIS — K7689 Other specified diseases of liver: Secondary | ICD-10-CM

## 2018-09-13 DIAGNOSIS — R1011 Right upper quadrant pain: Secondary | ICD-10-CM

## 2018-09-21 ENCOUNTER — Ambulatory Visit
Admission: RE | Admit: 2018-09-21 | Discharge: 2018-09-21 | Disposition: A | Discharge status: Home / Self Care | Payer: 59 | Source: Ambulatory Visit | Attending: Physician Assistant | Admitting: Physician Assistant

## 2018-09-21 DIAGNOSIS — K828 Other specified diseases of gallbladder: Secondary | ICD-10-CM | POA: Diagnosis not present

## 2018-09-21 DIAGNOSIS — K769 Liver disease, unspecified: Secondary | ICD-10-CM | POA: Diagnosis not present

## 2018-09-21 DIAGNOSIS — K8050 Calculus of bile duct without cholangitis or cholecystitis without obstruction: Secondary | ICD-10-CM | POA: Diagnosis not present

## 2018-09-21 DIAGNOSIS — K838 Other specified diseases of biliary tract: Secondary | ICD-10-CM | POA: Diagnosis not present

## 2018-09-21 DIAGNOSIS — N281 Cyst of kidney, acquired: Secondary | ICD-10-CM

## 2018-09-21 DIAGNOSIS — K7689 Other specified diseases of liver: Secondary | ICD-10-CM

## 2018-09-21 DIAGNOSIS — R1011 Right upper quadrant pain: Secondary | ICD-10-CM | POA: Diagnosis not present

## 2018-09-21 DIAGNOSIS — R1031 Right lower quadrant pain: Secondary | ICD-10-CM

## 2018-09-21 MED ORDER — IOPAMIDOL (ISOVUE-300) INJECTION 61%
100.0000 mL | Freq: Once | INTRAVENOUS | Status: AC | PRN
  Administered 2018-09-21: 100 mL via INTRAVENOUS

## 2018-10-04 ENCOUNTER — Other Ambulatory Visit: Payer: Self-pay | Admitting: Gastroenterology

## 2018-10-04 DIAGNOSIS — R932 Abnormal findings on diagnostic imaging of liver and biliary tract: Secondary | ICD-10-CM | POA: Diagnosis not present

## 2018-10-04 DIAGNOSIS — K838 Other specified diseases of biliary tract: Secondary | ICD-10-CM | POA: Diagnosis not present

## 2018-10-05 ENCOUNTER — Other Ambulatory Visit: Payer: Self-pay | Admitting: Gastroenterology

## 2018-10-09 DIAGNOSIS — G43709 Chronic migraine without aura, not intractable, without status migrainosus: Secondary | ICD-10-CM | POA: Diagnosis not present

## 2018-10-10 DIAGNOSIS — G43709 Chronic migraine without aura, not intractable, without status migrainosus: Secondary | ICD-10-CM | POA: Diagnosis not present

## 2018-10-16 ENCOUNTER — Ambulatory Visit (HOSPITAL_COMMUNITY): Payer: 59 | Admitting: Anesthesiology

## 2018-10-16 ENCOUNTER — Encounter (HOSPITAL_COMMUNITY)
Admission: RE | Disposition: A | Discharge status: Home / Self Care | Payer: Self-pay | Source: Ambulatory Visit | Attending: Gastroenterology

## 2018-10-16 ENCOUNTER — Encounter (HOSPITAL_COMMUNITY): Payer: Self-pay | Admitting: Anesthesiology

## 2018-10-16 ENCOUNTER — Ambulatory Visit (HOSPITAL_COMMUNITY): Payer: 59

## 2018-10-16 ENCOUNTER — Other Ambulatory Visit: Payer: Self-pay

## 2018-10-16 ENCOUNTER — Ambulatory Visit (HOSPITAL_COMMUNITY)
Admission: RE | Admit: 2018-10-16 | Discharge: 2018-10-16 | Disposition: A | Discharge status: Home / Self Care | Payer: 59 | Source: Ambulatory Visit | Attending: Gastroenterology | Admitting: Gastroenterology

## 2018-10-16 DIAGNOSIS — G40909 Epilepsy, unspecified, not intractable, without status epilepticus: Secondary | ICD-10-CM | POA: Diagnosis not present

## 2018-10-16 DIAGNOSIS — K831 Obstruction of bile duct: Secondary | ICD-10-CM | POA: Diagnosis not present

## 2018-10-16 DIAGNOSIS — K8050 Calculus of bile duct without cholangitis or cholecystitis without obstruction: Secondary | ICD-10-CM | POA: Diagnosis not present

## 2018-10-16 DIAGNOSIS — Z79899 Other long term (current) drug therapy: Secondary | ICD-10-CM | POA: Insufficient documentation

## 2018-10-16 DIAGNOSIS — E039 Hypothyroidism, unspecified: Secondary | ICD-10-CM | POA: Diagnosis not present

## 2018-10-16 DIAGNOSIS — F329 Major depressive disorder, single episode, unspecified: Secondary | ICD-10-CM | POA: Insufficient documentation

## 2018-10-16 DIAGNOSIS — Z79890 Hormone replacement therapy (postmenopausal): Secondary | ICD-10-CM | POA: Insufficient documentation

## 2018-10-16 DIAGNOSIS — F419 Anxiety disorder, unspecified: Secondary | ICD-10-CM | POA: Diagnosis not present

## 2018-10-16 DIAGNOSIS — K838 Other specified diseases of biliary tract: Secondary | ICD-10-CM | POA: Diagnosis not present

## 2018-10-16 HISTORY — PX: ERCP: SHX5425

## 2018-10-16 HISTORY — PX: SPHINCTEROTOMY: SHX5544

## 2018-10-16 HISTORY — PX: REMOVAL OF STONES: SHX5545

## 2018-10-16 SURGERY — ERCP, WITH INTERVENTION IF INDICATED
Anesthesia: General

## 2018-10-16 MED ORDER — SODIUM CHLORIDE 0.9 % IV SOLN
INTRAVENOUS | Status: DC | PRN
  Administered 2018-10-16: 15 mL

## 2018-10-16 MED ORDER — CIPROFLOXACIN IN D5W 400 MG/200ML IV SOLN
INTRAVENOUS | Status: AC
  Filled 2018-10-16: qty 200

## 2018-10-16 MED ORDER — FENTANYL CITRATE (PF) 100 MCG/2ML IJ SOLN
INTRAMUSCULAR | Status: AC
  Filled 2018-10-16: qty 2

## 2018-10-16 MED ORDER — MIDAZOLAM HCL 5 MG/5ML IJ SOLN
INTRAMUSCULAR | Status: DC | PRN
  Administered 2018-10-16: 2 mg via INTRAVENOUS

## 2018-10-16 MED ORDER — PROPOFOL 10 MG/ML IV BOLUS
INTRAVENOUS | Status: DC | PRN
  Administered 2018-10-16: 100 mg via INTRAVENOUS

## 2018-10-16 MED ORDER — SUGAMMADEX SODIUM 200 MG/2ML IV SOLN
INTRAVENOUS | Status: DC | PRN
  Administered 2018-10-16: 200 mg via INTRAVENOUS

## 2018-10-16 MED ORDER — ONDANSETRON HCL 4 MG/2ML IJ SOLN
INTRAMUSCULAR | Status: DC | PRN
  Administered 2018-10-16: 4 mg via INTRAVENOUS

## 2018-10-16 MED ORDER — MIDAZOLAM HCL 2 MG/2ML IJ SOLN
INTRAMUSCULAR | Status: AC
  Filled 2018-10-16: qty 2

## 2018-10-16 MED ORDER — ROCURONIUM BROMIDE 10 MG/ML (PF) SYRINGE
PREFILLED_SYRINGE | INTRAVENOUS | Status: DC | PRN
  Administered 2018-10-16: 40 mg via INTRAVENOUS

## 2018-10-16 MED ORDER — PHENYLEPHRINE 40 MCG/ML (10ML) SYRINGE FOR IV PUSH (FOR BLOOD PRESSURE SUPPORT)
PREFILLED_SYRINGE | INTRAVENOUS | Status: DC | PRN
  Administered 2018-10-16 (×4): 80 ug via INTRAVENOUS

## 2018-10-16 MED ORDER — INDOMETHACIN 50 MG RE SUPP
RECTAL | Status: AC
  Filled 2018-10-16: qty 2

## 2018-10-16 MED ORDER — LACTATED RINGERS IV SOLN
INTRAVENOUS | Status: DC
  Administered 2018-10-16: 1000 mL via INTRAVENOUS

## 2018-10-16 MED ORDER — LIDOCAINE 2% (20 MG/ML) 5 ML SYRINGE
INTRAMUSCULAR | Status: DC | PRN
  Administered 2018-10-16: 100 mg via INTRAVENOUS

## 2018-10-16 MED ORDER — SODIUM CHLORIDE 0.9 % IV SOLN: INTRAVENOUS | Status: DC

## 2018-10-16 MED ORDER — GLUCAGON HCL RDNA (DIAGNOSTIC) 1 MG IJ SOLR
INTRAMUSCULAR | Status: AC
  Filled 2018-10-16: qty 1

## 2018-10-16 MED ORDER — DEXAMETHASONE SODIUM PHOSPHATE 10 MG/ML IJ SOLN
INTRAMUSCULAR | Status: DC | PRN
  Administered 2018-10-16: 10 mg via INTRAVENOUS

## 2018-10-16 MED ORDER — FENTANYL CITRATE (PF) 100 MCG/2ML IJ SOLN
INTRAMUSCULAR | Status: DC | PRN
  Administered 2018-10-16: 50 ug via INTRAVENOUS

## 2018-10-16 MED ORDER — INDOMETHACIN 50 MG RE SUPP
RECTAL | Status: DC | PRN
  Administered 2018-10-16: 100 mg via RECTAL

## 2018-10-16 MED ORDER — CIPROFLOXACIN IN D5W 400 MG/200ML IV SOLN
INTRAVENOUS | Status: DC | PRN
  Administered 2018-10-16: 400 mg via INTRAVENOUS

## 2018-10-16 MED ORDER — PROPOFOL 10 MG/ML IV BOLUS
INTRAVENOUS | Status: AC
  Filled 2018-10-16: qty 20

## 2018-10-16 NOTE — Op Note (Addendum)
[facility] Patient Name: Laurie Oneal Procedure Date: 10/16/2018 MRN: [id number] Attending MD: Vida Rigger , MD Date of Birth: [birth year] CSN: [id number] Age: 47 Admit Type: Outpatient Procedure:                ERCP Indications:              Bile duct stone on Computed Tomogram Scan inpatient                            with periodic pain and chronic nausea Providers:                Vida Rigger, MD, Janae Sauce. Steele Berg, RN, [place],                            [employer], Albertina Senegal. Alday CRNA, CRNA Referring MD:              Medicines:                General Anesthesia Complications:            No immediate complications. Estimated Blood Loss:     Estimated blood loss: none. Procedure:                Pre-Anesthesia Assessment:                           - Prior to the procedure, a History and Physical                            was performed, and patient medications and                            allergies were reviewed. The patient's tolerance of                            previous anesthesia was also reviewed. The risks                            and benefits of the procedure and the sedation                            options and risks were discussed with the patient.                            All questions were answered, and informed consent                            was obtained. Prior Anticoagulants: The patient has                            taken no previous anticoagulant or antiplatelet                            agents. ASA Grade Assessment: I - A normal, healthy  patient. After reviewing the risks and benefits,                            the patient was deemed in satisfactory condition to                            undergo the procedure.                           After obtaining informed consent, the scope was                            passed under direct vision. Throughout the                            procedure, the  patient's blood pressure, pulse, and                            oxygen saturations were monitored continuously. The                            [id number]-[id number] ([id number] Olympus ERCP was introduced                            through the mouth, and used to inject contrast into                            and used to cannulate the bile duct. The ERCP was                            accomplished without difficulty. The patient                            tolerated the procedure well. Scope In: Scope Out: Findings:      The major papilla was slightly bulging. On initial attempts to cannulate       the wire went a short ways toward the pancreas and the sphincterotome       was repositioned deep selective cannulation was readily obtained and we       proceeded with a biliary sphincterotomy which was made with a Hydratome       sphincterotome using ERBE electrocautery. There was no       post-sphincterotomy bleeding. We had adequate biliary drainage and we       could get the fully bowed sphincterotome easily in and out of the duct.       the biliary tree was swept with an adjustable 9- 12 mm balloon starting       at the bifurcation. We use the 12 mm balloon size only passed readily       through the patent sphincterotomy site and one tiny stone and minimal       amount of sludge was removed. No stones remained on occlusion       cholangiogram at the end of the procedure multiple balloon sweeps were       done with adequate biliary drainage at the end of the procedure Impression:               -  The major papilla appeared to be slightly                            bulging. The wire was advanced towards the pancreas                            one time but no pancreatic injection                           - Choledocholithiasis was found. Complete removal                            was accomplished by biliary sphincterotomy and                            balloon extraction.                           - A  biliary sphincterotomy was performed.                           - The biliary tree was swept. No residual                            abnormalities were seen on occlusion cholangiogram                            at the end of the procedure Moderate Sedation:      Not Applicable - Patient had care per Anesthesia. Recommendation:           - Clear liquid diet for 6 hours. If doing well may                            have soft solids later today or tomorrow                           - Continue present medications.                           - Return to GI clinic in 3 weeks.                           - Telephone GI clinic if symptomatic PRN. If                            symptoms do not resolve consider CCK HIDA scan next Procedure Code(s):        --- Professional ---                           [phone], Endoscopic retrograde                            cholangiopancreatography (ERCP); with removal of  calculi/debris from biliary/pancreatic duct(s)                           [id number], Endoscopic retrograde                            cholangiopancreatography (ERCP); with                            sphincterotomy/papillotomy Diagnosis Code(s):        --- Professional ---                           K80.50, Calculus of bile duct without cholangitis                            or cholecystitis without obstruction                           K83.8, Other specified diseases of biliary tract CPT copyright 2018 [employer] Association. All rights reserved. The codes documented in this report are preliminary and upon coder review may  be revised to meet current compliance requirements. Vida Rigger, MD 10/16/2018 1:51:30 PM This report has been signed electronically. Number of Addenda: 0

## 2018-10-16 NOTE — Anesthesia Postprocedure Evaluation (Signed)
Anesthesia Post Note  Patient: Conrad BurlingtonKatherine J Oboyle  Procedure(s) Performed: ENDOSCOPIC RETROGRADE CHOLANGIOPANCREATOGRAPHY (ERCP) (N/A ) SPHINCTEROTOMY REMOVAL OF STONES     Patient location during evaluation: PACU Anesthesia Type: General Level of consciousness: awake and alert Pain management: pain level controlled Vital Signs Assessment: post-procedure vital signs reviewed and stable Respiratory status: spontaneous breathing, nonlabored ventilation, respiratory function stable and patient connected to nasal cannula oxygen Cardiovascular status: blood pressure returned to baseline and stable Postop Assessment: no apparent nausea or vomiting Anesthetic complications: no    Last Vitals:  Vitals:   10/16/18 1410 10/16/18 1420  BP: 111/86 116/72  Pulse: 67 61  Resp: 17 13  Temp:    SpO2: 99% 100%    Last Pain:  Vitals:   10/16/18 1420  TempSrc:   PainSc: 5                  Shelton SilvasKevin D Zakariyah Freimark

## 2018-10-16 NOTE — Anesthesia Preprocedure Evaluation (Addendum)
Anesthesia Evaluation  Patient identified by MRN, date of birth, ID band Patient awake    Reviewed: Allergy & Precautions, NPO status , Patient's Chart, lab work & pertinent test results  Airway Mallampati: I  TM Distance: >3 FB Neck ROM: Full    Dental  (+) Teeth Intact, Dental Advisory Given   Pulmonary    breath sounds clear to auscultation       Cardiovascular negative cardio ROS   Rhythm:Regular Rate:Normal     Neuro/Psych  Headaches, Seizures -,     GI/Hepatic negative GI ROS, Neg liver ROS,   Endo/Other  Hypothyroidism   Renal/GU negative Renal ROS     Musculoskeletal negative musculoskeletal ROS (+)   Abdominal Normal abdominal exam  (+)   Peds  Hematology negative hematology ROS (+)   Anesthesia Other Findings   Reproductive/Obstetrics                            Anesthesia Physical Anesthesia Plan  ASA: II  Anesthesia Plan: General   Post-op Pain Management:    Induction: Intravenous  PONV Risk Score and Plan: 3 and Ondansetron, Dexamethasone and Midazolam  Airway Management Planned: Oral ETT  Additional Equipment: None  Intra-op Plan:   Post-operative Plan: Extubation in OR  Informed Consent: I have reviewed the patients History and Physical, chart, labs and discussed the procedure including the risks, benefits and alternatives for the proposed anesthesia with the patient or authorized representative who has indicated his/her understanding and acceptance.   Dental advisory given  Plan Discussed with: CRNA  Anesthesia Plan Comments:        Anesthesia Quick Evaluation

## 2018-10-16 NOTE — Transfer of Care (Signed)
Immediate Anesthesia Transfer of Care Note  Patient: Laurie Oneal  Procedure(s) Performed: ENDOSCOPIC RETROGRADE CHOLANGIOPANCREATOGRAPHY (ERCP) (N/A ) SPHINCTEROTOMY  Patient Location: PACU  Anesthesia Type:General  Level of Consciousness: sedated  Airway & Oxygen Therapy: Patient Spontanous Breathing and Patient connected to face mask oxygen  Post-op Assessment: Report given to RN and Post -op Vital signs reviewed and stable  Post vital signs: Reviewed and stable  Last Vitals:  Vitals Value Taken Time  BP    Temp    Pulse    Resp    SpO2      Last Pain:  Vitals:   10/16/18 1220  TempSrc: Oral  PainSc: 0-No pain         Complications: No apparent anesthesia complications

## 2018-10-16 NOTE — Discharge Instructions (Signed)
Endoscopic Retrograde Cholangiopancreatogram, Care After This sheet gives you information about how to care for yourself after your procedure. Your health care provider may also give you more specific instructions. If you have problems or questions, contact your health care provider. What can I expect after the procedure? After the procedure, it is common to have:  Soreness in your throat.  Nausea.  Bloating.  Dizziness.  Tiredness (fatigue).  Follow these instructions at home:  Take over-the-counter and prescription medicines only as told by your health care provider.  Do not drive for 24 hours if you were given a medicine to help you relax (sedative) during your procedure. Have someone stay with you for 24 hours after the procedure.  Return to your normal activities as told by your health care provider. Ask your health care provider what activities are safe for you.  Return to eating what you normally do as soon as you feel well enough or as told by your health care provider.  Keep all follow-up visits as told by your health care provider. This is important. Contact a health care provider if:  You have pain in your abdomen that does not get better with medicine.  You develop signs of infection, such as: ? Chills. ? Feeling unwell. Get help right away if:  You have difficulty swallowing.  You have worsening pain in your throat, chest, or abdomen.  You vomit bright red blood or a substance that looks like coffee grounds.  You have bloody or very black stools.  You have a fever.  You have a sudden increase in swelling (bloating) in your abdomen. Summary  After the procedure, it is common to feel tired and to have some discomfort in your throat.  Contact your health care provider if you have signs of infection--such as chills or feeling unwell--or if you have pain that does not improve with medicine.  Get help right away if you have trouble swallowing, worsening  pain, bloody or black vomit, bloody or black stools, a fever, or increased swelling in your abdomen.  Keep all follow-up visits as told by your health care provider. This is important. This information is not intended to replace advice given to you by your health care provider. Make sure you discuss any questions you have with your health care provider. Document Released: 09/04/2013 Document Revised: 10/03/2016 Document Reviewed: 10/03/2016 Elsevier Interactive Patient Education  2017 [employer] Call if question or problem or if symptoms worsen otherwise clear liquid diet until 8 PM tonight and if doing well may have soft solids later tonight or tomorrow and if doing well follow-up in 3 weeks in the office

## 2018-10-16 NOTE — Progress Notes (Signed)
Laurie Oneal 1:03 PM  Subjective: Patient without any new complaints since we saw her recently in the office  Objective: Vital signs stable afebrile no acute distress exam please see previous assessment evaluation  Assessment: Abnormal CT scan worrisome for stones and sludge in the bile duct and patient with nausea and periodic pain  Plan: Okay to proceed with ERCP with anesthesia assistance  [facility]MAGOD,Laurie Oneal E  Pager [phone] After 5PM or if no answer call [phone]

## 2018-10-16 NOTE — Anesthesia Procedure Notes (Signed)
Procedure Name: Intubation Date/Time: 10/16/2018 1:19 PM Performed by: Lind Covert, CRNA Pre-anesthesia Checklist: Patient identified, Emergency Drugs available, Suction available and Patient being monitored Patient Re-evaluated:Patient Re-evaluated prior to induction Oxygen Delivery Method: Circle system utilized Preoxygenation: Pre-oxygenation with 100% oxygen Induction Type: IV induction Ventilation: Mask ventilation without difficulty Laryngoscope Size: Mac and 3 Grade View: Grade I Tube type: Oral Tube size: 7.0 mm Number of attempts: 1 Airway Equipment and Method: Stylet Placement Confirmation: ETT inserted through vocal cords under direct vision,  positive ETCO2 and breath sounds checked- equal and bilateral Secured at: 22 cm Tube secured with: Tape Dental Injury: Teeth and Oropharynx as per pre-operative assessment

## 2018-10-17 ENCOUNTER — Emergency Department (HOSPITAL_COMMUNITY): Payer: 59

## 2018-10-17 ENCOUNTER — Other Ambulatory Visit: Payer: Self-pay

## 2018-10-17 ENCOUNTER — Encounter (HOSPITAL_COMMUNITY): Payer: Self-pay | Admitting: Gastroenterology

## 2018-10-17 ENCOUNTER — Emergency Department (HOSPITAL_COMMUNITY)
Admission: EM | Admit: 2018-10-17 | Discharge: 2018-10-18 | Disposition: A | Discharge status: Home / Self Care | Payer: 59 | Attending: Emergency Medicine | Admitting: Emergency Medicine

## 2018-10-17 DIAGNOSIS — Z91040 Latex allergy status: Secondary | ICD-10-CM | POA: Diagnosis not present

## 2018-10-17 DIAGNOSIS — E039 Hypothyroidism, unspecified: Secondary | ICD-10-CM | POA: Diagnosis not present

## 2018-10-17 DIAGNOSIS — Z79899 Other long term (current) drug therapy: Secondary | ICD-10-CM | POA: Diagnosis not present

## 2018-10-17 DIAGNOSIS — R109 Unspecified abdominal pain: Secondary | ICD-10-CM | POA: Diagnosis present

## 2018-10-17 DIAGNOSIS — R1011 Right upper quadrant pain: Secondary | ICD-10-CM | POA: Diagnosis not present

## 2018-10-17 LAB — URINALYSIS, ROUTINE W REFLEX MICROSCOPIC
BILIRUBIN URINE: NEGATIVE
Glucose, UA: NEGATIVE mg/dL
Hgb urine dipstick: NEGATIVE
KETONES UR: NEGATIVE mg/dL
Leukocytes, UA: NEGATIVE
Nitrite: NEGATIVE
PH: 7 (ref 5.0–8.0)
Protein, ur: NEGATIVE mg/dL
Specific Gravity, Urine: 1.004 — ABNORMAL LOW (ref 1.005–1.030)

## 2018-10-17 LAB — COMPREHENSIVE METABOLIC PANEL
ALK PHOS: 61 U/L (ref 38–126)
ALT: 33 U/L (ref 0–44)
ANION GAP: 7 (ref 5–15)
AST: 60 U/L — ABNORMAL HIGH (ref 15–41)
Albumin: 3.9 g/dL (ref 3.5–5.0)
BILIRUBIN TOTAL: 0.3 mg/dL (ref 0.3–1.2)
BUN: 14 mg/dL (ref 6–20)
CALCIUM: 8.7 mg/dL — AB (ref 8.9–10.3)
CO2: 24 mmol/L (ref 22–32)
Chloride: 109 mmol/L (ref 98–111)
Creatinine, Ser: 1.01 mg/dL — ABNORMAL HIGH (ref 0.44–1.00)
GFR calc Af Amer: >60 mL/min (ref >60)
Glucose, Bld: 160 mg/dL — ABNORMAL HIGH (ref 70–99)
Potassium: 3.3 mmol/L — ABNORMAL LOW (ref 3.5–5.1)
Sodium: 140 mmol/L (ref 135–145)
TOTAL PROTEIN: 6.4 g/dL — AB (ref 6.5–8.1)

## 2018-10-17 LAB — CBC
HEMATOCRIT: 41.3 % (ref 36.0–46.0)
HEMOGLOBIN: 14 g/dL (ref 12.0–15.0)
MCH: 31 pg (ref 26.0–34.0)
MCHC: 33.9 g/dL (ref 30.0–36.0)
MCV: 91.6 fL (ref 80.0–100.0)
NRBC: 0 % (ref 0.0–0.2)
Platelets: 234 10*3/uL (ref 150–400)
RBC: 4.51 MIL/uL (ref 3.87–5.11)
RDW: 12.4 % (ref 11.5–15.5)
WBC: 12.7 10*3/uL — ABNORMAL HIGH (ref 4.0–10.5)

## 2018-10-17 LAB — LIPASE, BLOOD: Lipase: 39 U/L (ref 11–51)

## 2018-10-17 LAB — I-STAT BETA HCG BLOOD, ED (MC, WL, AP ONLY)

## 2018-10-17 MED ORDER — FENTANYL CITRATE (PF) 100 MCG/2ML IJ SOLN
50.0000 ug | Freq: Once | INTRAMUSCULAR | Status: AC
  Administered 2018-10-17: 50 ug via INTRAVENOUS
  Filled 2018-10-17: qty 2

## 2018-10-17 MED ORDER — IOHEXOL 300 MG/ML  SOLN
30.0000 mL | Freq: Once | INTRAMUSCULAR | Status: AC | PRN
  Administered 2018-10-17: 30 mL via ORAL

## 2018-10-17 MED ORDER — DICYCLOMINE HCL 20 MG PO TABS: 20.0000 mg | ORAL_TABLET | Freq: Three times a day (TID) | ORAL | 0 refills | Status: DC | PRN

## 2018-10-17 MED ORDER — POLYETHYLENE GLYCOL 3350 17 G PO PACK: 17.0000 g | PACK | Freq: Every day | ORAL | 0 refills | Status: DC

## 2018-10-17 MED ORDER — IOPAMIDOL (ISOVUE-300) INJECTION 61%
INTRAVENOUS | Status: AC
  Filled 2018-10-17: qty 100

## 2018-10-17 MED ORDER — SODIUM CHLORIDE (PF) 0.9 % IJ SOLN
INTRAMUSCULAR | Status: AC
  Filled 2018-10-17: qty 50

## 2018-10-17 MED ORDER — IOPAMIDOL (ISOVUE-300) INJECTION 61%
100.0000 mL | Freq: Once | INTRAVENOUS | Status: AC | PRN
  Administered 2018-10-17: 100 mL via INTRAVENOUS

## 2018-10-17 MED ORDER — DICYCLOMINE HCL 10 MG/ML IM SOLN
20.0000 mg | Freq: Once | INTRAMUSCULAR | Status: AC
  Administered 2018-10-18: 20 mg via INTRAMUSCULAR
  Filled 2018-10-17: qty 2

## 2018-10-17 NOTE — ED Provider Notes (Addendum)
[facility]-EMERGENCY DEPT Provider Note   CSN: [id number] Arrival date & time: 10/17/18  1639     History   Chief Complaint Chief Complaint  Patient presents with  . Abdominal Pain    HPI Laurie Oneal is a 47 y.o. female.  HPI Patient presents with abdominal pain.  Right-sided and mid abdomen.  Was severe earlier today.  Yesterday she had ERCP with some removal of stones by Dr. Ewing SchleinMagod.  Has had some pain since then but then today much more severe.  States she had to lay on the bathroom floor because it was so severe.  Felt sweaty when it came on.  Somewhat decreased appetite today.  No fevers.  No abdominal swelling.  States his pain was different than the pain she was having before the ERCP.  Still some pain but feeling better than she was earlier. Past Medical History:  Diagnosis Date  . Headache(784.0)   . Hypothyroidism   . Seizures (HCC)    had sz as child and when they went away she started to have migrains    Patient Active Problem List   Diagnosis Date Noted  . GASTROENTERITIS 03/22/2010  . HYPOTHYROIDISM 12/10/2009  . INSOMNIA 12/01/2009  . MIGRAINE HEADACHE 06/11/2009  . NECK PAIN 06/10/2009  . ALLERGIC RHINITIS DUE TO OTHER ALLERGEN 12/27/2007  . SEIZURE DISORDER, HX OF 12/27/2007  . 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  . HEADACHE 08/28/2007    Past Surgical History:  Procedure Laterality Date  . CESAREAN SECTION     x3  . ERCP N/A 10/16/2018   Procedure: ENDOSCOPIC RETROGRADE CHOLANGIOPANCREATOGRAPHY (ERCP);  Surgeon: Vida RiggerMagod, Marc, MD;  [facility];  Service: Endoscopy;  Laterality: N/A;  . REMOVAL OF STONES  10/16/2018   Procedure: REMOVAL OF STONES;  Surgeon: Vida RiggerMagod, Marc, MD;  Location: [facility];  Service: Endoscopy;;  . Dennison MascotSPHINCTEROTOMY  10/16/2018   Procedure: Dennison MascotSPHINCTEROTOMY;  Surgeon: Vida RiggerMagod, Marc, MD;  Location: [facility];  Service: Endoscopy;;  . TUBAL LIGATION       OB History    Gravida  4   Para  3   Term  3   Preterm  0   AB  1   Living  3     SAB  1   TAB  0   Ectopic  0   Multiple  0   Live Births               Home Medications    Prior to Admission medications   Medication Sig Start Date End Date Taking? Authorizing Provider  b complex vitamins tablet Take 1 tablet by mouth daily. Takes sporatically   Yes [provider]  gabapentin (NEURONTIN) 300 MG capsule Take 1 capsule (300 mg total) by mouth 2 (two) times daily. Patient taking differently: Take 300 mg by mouth at bedtime as needed.  06/09/17  Yes Tyrell AntonioNewton, Frederic, MD  GLUCOSAMINE PO Take 1 capsule by mouth daily.   Yes [provider]  ibuprofen (ADVIL,MOTRIN) 200 MG tablet Take 600 mg by mouth every 6 (six) hours as needed for headache.   Yes [provider]  ketotifen (ALLERGY EYE DROPS) 0.025 % ophthalmic solution Place 1 drop into both eyes every morning.   Yes [provider]  levothyroxine (SYNTHROID, LEVOTHROID) 75 MCG tablet Take 37.5 mcg by mouth daily before breakfast.    Yes [provider]  LORazepam (ATIVAN) 1 MG tablet Take 1 mg by mouth at bedtime as needed  for sleep.   Yes [provider]  Multiple Vitamin (MULTIVITAMIN WITH MINERALS) TABS Take 1 tablet by mouth daily.   Yes [provider]  sertraline (ZOLOFT) 100 MG tablet Take 100 mg by mouth at bedtime.   Yes [provider]  tiZANidine (ZANAFLEX) 4 MG tablet Take 4-8 mg by mouth at bedtime.    Yes [provider]  zolpidem (AMBIEN) 10 MG tablet Take 10 mg by mouth at bedtime as needed for sleep.   Yes [provider]  zonisamide (ZONEGRAN) 100 MG capsule Take 200 mg by mouth daily.    Yes [provider]  Cholecalciferol (VITAMIN D3) 125 MCG (5000 UT) CAPS Take 1 capsule by mouth daily.     [provider]  dicyclomine (BENTYL) 20 MG tablet Take 1 tablet (20 mg total) by mouth 3 (three) times daily as needed  for spasms. 10/17/18   Benjiman Core, MD  polyethylene glycol [facility]) packet Take 17 g by mouth daily. 10/17/18   Benjiman Core, MD    Family History History reviewed. No pertinent family history.  Social History Social History   Tobacco Use  . Smoking status: Never Smoker  . Smokeless tobacco: Never Used  Substance Use Topics  . Alcohol use: Yes    Alcohol/week: 0.0 standard drinks    Comment: occasionally  . Drug use: No     Allergies   Percocet [oxycodone-acetaminophen]; Sumatriptan; Amoxicillin-pot clavulanate; Amoxicillin-pot clavulanate; Erythromycin; Latex; Migranal [dihydroergotamine]; and Penicillins   Review of Systems Review of Systems  Constitutional: Positive for appetite change and diaphoresis.  Respiratory: Negative for shortness of breath.   Cardiovascular: Negative for chest pain.  Gastrointestinal: Positive for abdominal pain. Negative for nausea and vomiting.  Genitourinary: Negative for dysuria.  Musculoskeletal: Positive for back pain.  Skin: Negative for rash.  Neurological: Negative for weakness and numbness.  Hematological: Negative for adenopathy.  Psychiatric/Behavioral: Negative for confusion.     Physical Exam Updated Vital Signs BP 133/64 (BP Location: Left Arm)   Pulse 73   Temp (!) 97.3 F (36.3 C) (Oral)   Resp 20   Wt 53.5 kg   SpO2 97%   BMI 23.05 kg/m   Physical Exam  Constitutional: She appears well-developed.  HENT:  Head: Normocephalic.  Eyes: EOM are normal. No scleral icterus.  Cardiovascular: Regular rhythm.  Pulmonary/Chest: Effort normal.  Abdominal: Normal appearance.  Skin: Skin is warm. Capillary refill takes less than 2 seconds.     ED Treatments / Results  Labs (all labs ordered are listed, but only abnormal results are displayed) Labs Reviewed  COMPREHENSIVE METABOLIC PANEL - Abnormal; Notable for the following components:      Result Value   Potassium 3.3 (*)    Glucose, Bld 160 (*)     Creatinine, Ser 1.01 (*)    Calcium 8.7 (*)    Total Protein 6.4 (*)    AST 60 (*)    All other components within normal limits  CBC - Abnormal; Notable for the following components:   WBC 12.7 (*)    All other components within normal limits  URINALYSIS, ROUTINE W REFLEX MICROSCOPIC - Abnormal; Notable for the following components:   Color, Urine STRAW (*)    Specific Gravity, Urine 1.004 (*)    All other components within normal limits  LIPASE, BLOOD  I-STAT BETA HCG BLOOD, ED ([facility], [facility], [facility] ONLY)    EKG None  Radiology Ct Abdomen Pelvis W Contrast  Result Date: 10/17/2018 CLINICAL DATA:  ERCP yesterday. Today with sudden onset severe right upper quadrant pain and nausea. EXAM: CT ABDOMEN AND PELVIS WITH CONTRAST TECHNIQUE: Multidetector CT imaging of the abdomen and pelvis was performed using the standard protocol following bolus administration of intravenous contrast. CONTRAST:  ISOVUE-300 IOPAMIDOL (ISOVUE-300) INJECTION 61% COMPARISON:  09/21/2018 FINDINGS: Lower chest: Lung bases are clear. Hepatobiliary: Pneumobilia and gallbladder gas, likely postprocedural. No significant bile duct dilatation. Gallbladder is not abnormally distended. No stones or wall thickening identified. Pancreas: Unremarkable. No pancreatic ductal dilatation or surrounding inflammatory changes. Spleen: Normal in size without focal abnormality. Adrenals/Urinary Tract: Adrenal glands are unremarkable. Kidneys are normal, without renal calculi, focal lesion, or hydronephrosis. Bladder is decompressed. Stomach/Bowel: Stomach, small bowel, and colon are not abnormally distended. No wall thickening or inflammatory changes appreciated. Scattered stool throughout the colon. Appendix is normal. Vascular/Lymphatic: No significant vascular findings are present. No enlarged abdominal or pelvic lymph nodes. Reproductive: Uterus and ovaries are not enlarged. Cystic structure in the uterus. Possibly a necrotic uterine  fibroid. Consider correlation with pregnancy test. Probable involuting cyst on the left ovary. Calcification in the right ovary. Small amount of free fluid in the pelvis is probably physiologic. Other: No free air in the abdomen. Abdominal wall musculature appears intact. Musculoskeletal: No acute or significant osseous findings. IMPRESSION: 1. No evidence of bowel obstruction or inflammation. 2. Pneumobilia and gallbladder gas likely postprocedural. No bile duct dilatation. 3. Cystic structure in the uterus likely to represent a necrotic uterine fibroid. Consider correlation with pregnancy test. Electronically Signed   By: Burman Nieves M.D.   On: 10/17/2018 23:45   Dg Ercp Biliary & Pancreatic Ducts  Result Date: 10/16/2018 CLINICAL DATA:  Biliary dilatation with suspected choledocholithiasis in the distal common bile duct by CT. EXAM: ERCP TECHNIQUE: Multiple spot images obtained with the fluoroscopic device and submitted for interpretation post-procedure. COMPARISON:  CT of the abdomen on 09/21/2018 FINDINGS: Images obtained with a C-arm demonstrate cannulation of the common bile duct with contrast injection demonstrating mildly prominent caliber of the common bile duct. No obvious discrete filling defect identified. Balloon sweep maneuver was performed to extract calculi. IMPRESSION: Mildly dilated common bile duct. Balloon sweep maneuver was performed. These images were submitted for radiologic interpretation only. Please see the procedural report for the amount of contrast and the fluoroscopy time utilized. Electronically Signed   By: Irish Lack M.D.   On: 10/16/2018 14:19    Procedures Procedures (including critical care time)  Medications Ordered in ED Medications  iopamidol (ISOVUE-300) 61 % injection (has no administration in time range)  sodium chloride (PF) 0.9 % injection (has no administration in time range)  dicyclomine (BENTYL) injection 20 mg (has no administration in time  range)  iohexol (OMNIPAQUE) 300 MG/ML solution 30 mL (30 mLs Oral Contrast Given 10/17/18 2054)  fentaNYL (SUBLIMAZE) injection 50 mcg (50 mcg Intravenous Given 10/17/18 2200)  iopamidol (ISOVUE-300) 61 % injection 100 mL (100 mLs Intravenous Contrast Given 10/17/18 2313)     Initial Impression / Assessment and Plan / ED Course  I have reviewed the triage vital signs and the nursing notes.  Pertinent labs & imaging results that were available during my care of the patient were reviewed by me and considered in my medical decision making (see chart for details).     Patient with abdominal pain post ERCP.  Lab work overall reassuring but white count is mildly elevated.  Discussed with [facility].  Will get CT scan to further evaluate.  Care will  be turned over to Dr. Read Drivers.   CT scan done and reassuring.  Will give dose of IV Bentyl here and discharged home with MiraLAX and oral Bentyl.  Follow-up with GI as needed.  Final Clinical Impressions(s) / ED Diagnoses   Final diagnoses:  Abdominal pain, unspecified abdominal location    ED Discharge Orders         Ordered    polyethylene glycol (MIRALAX) packet  Daily     10/17/18 2354    dicyclomine (BENTYL) 20 MG tablet  3 times daily PRN     10/17/18 2354           Benjiman Core, MD 10/17/18 2259    Benjiman Core, MD 10/18/18 0000

## 2018-10-17 NOTE — ED Triage Notes (Signed)
Pt reports that she had an ERCP yesterday and went back to work today. Pt reports that they cleaned out sludge and stones from common bile duct. Today she had a sudden onset of severe RUQ pain and nausea. Pt reports the pain has subsided some.

## 2018-11-02 DIAGNOSIS — K8050 Calculus of bile duct without cholangitis or cholecystitis without obstruction: Secondary | ICD-10-CM | POA: Diagnosis not present

## 2018-12-30 DIAGNOSIS — G43709 Chronic migraine without aura, not intractable, without status migrainosus: Secondary | ICD-10-CM | POA: Diagnosis not present

## 2019-01-10 DIAGNOSIS — G43709 Chronic migraine without aura, not intractable, without status migrainosus: Secondary | ICD-10-CM | POA: Diagnosis not present

## 2019-01-16 DIAGNOSIS — R6889 Other general symptoms and signs: Secondary | ICD-10-CM | POA: Diagnosis not present

## 2019-01-30 DIAGNOSIS — R5383 Other fatigue: Secondary | ICD-10-CM | POA: Diagnosis not present

## 2019-01-30 DIAGNOSIS — J988 Other specified respiratory disorders: Secondary | ICD-10-CM | POA: Diagnosis not present

## 2019-01-30 DIAGNOSIS — G4700 Insomnia, unspecified: Secondary | ICD-10-CM | POA: Diagnosis not present

## 2019-02-09 IMAGING — CT CT ABD-PELV W/ CM
2 of 5 series · 15 of 46 positions shown, 17 images · IV contrast (iopamidol)
Comparison: Abdominal ultrasound 09/07/2018.

CLINICAL DATA: Right-sided abdominal pain, right upper quadrant
pain.

EXAM:
CT ABDOMEN AND PELVIS WITH CONTRAST
TECHNIQUE: Multidetector CT imaging of the abdomen and pelvis was performed
using the standard protocol following bolus administration of
intravenous contrast.
CONTRAST:  100mL [id number] IOPAMIDOL (JJ38FF-K00) INJECTION 61%

[Series 2: abd pelvis 5.00 br40 s3 ax · axial · 0.65mm/px · z∈[+1268,+1633]mm · 12 of 83 slices shown, 14 images]
[im 5/83  soft-tissue]
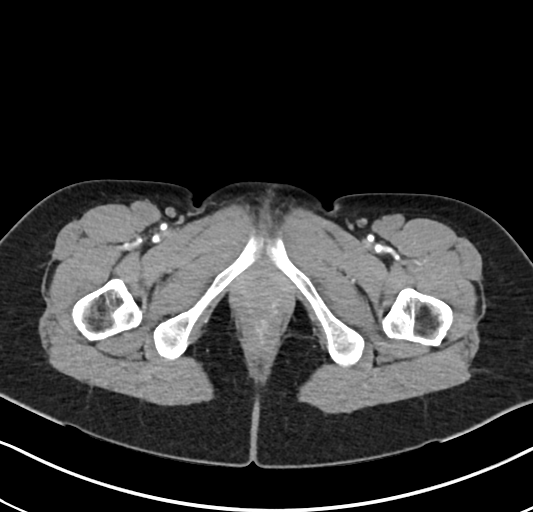
[im 5/83  bone]
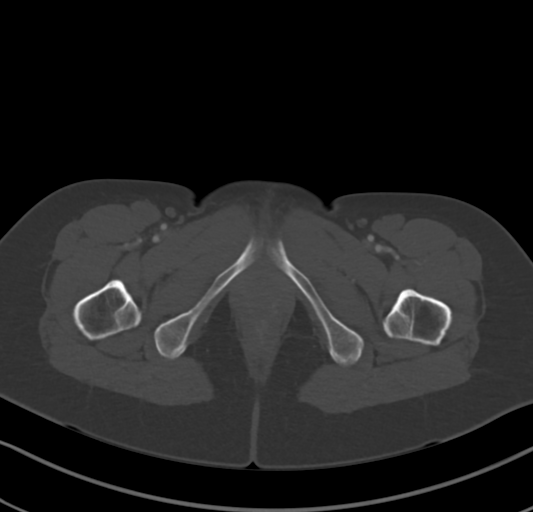
[im 14/83  soft-tissue]
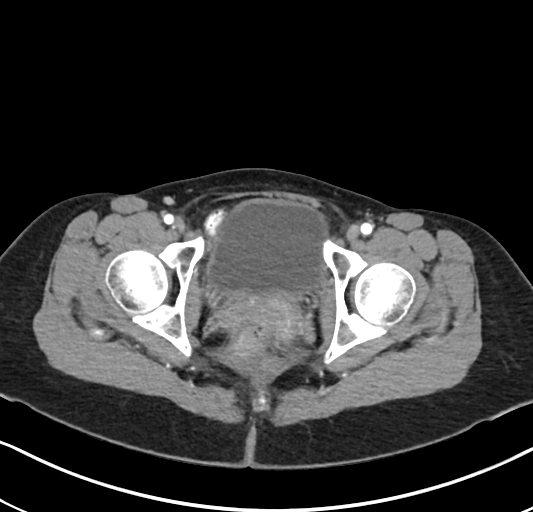
[im 19/83  soft-tissue]
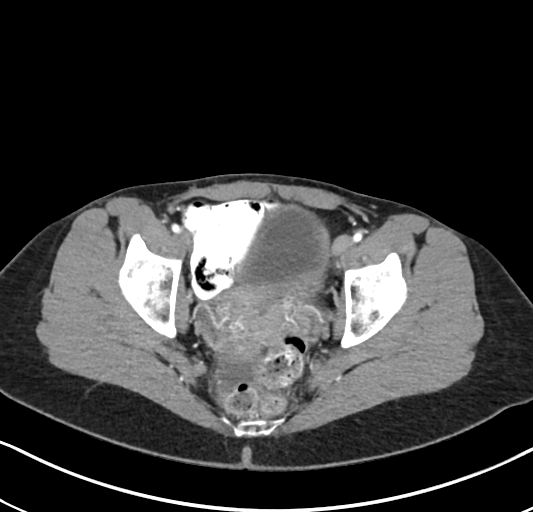
[im 23/83  soft-tissue]
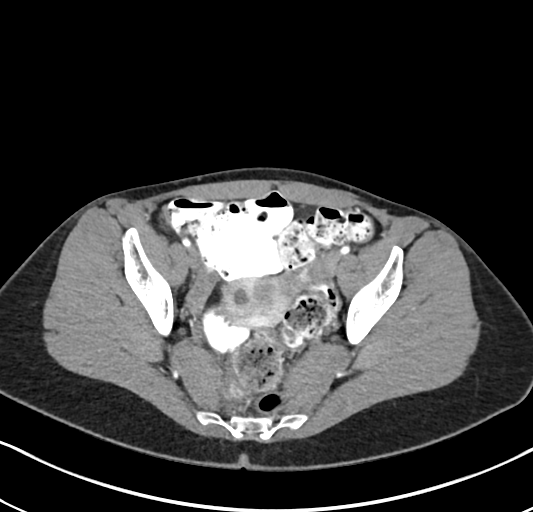
[im 32/83  soft-tissue]
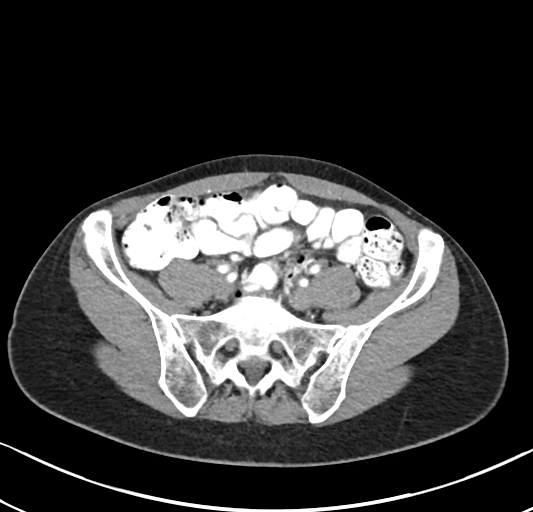
[im 37/83  soft-tissue]
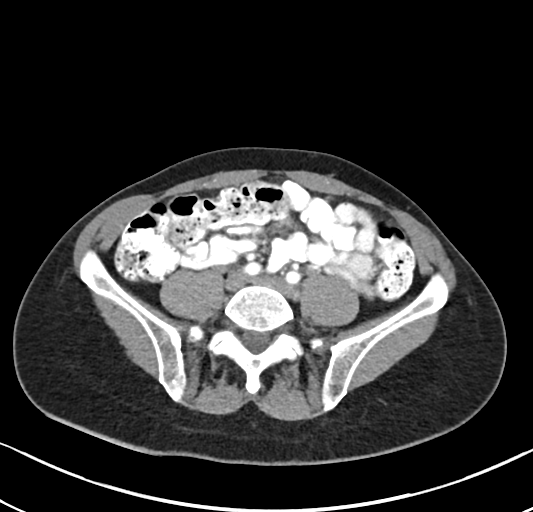
[im 46/83  soft-tissue]
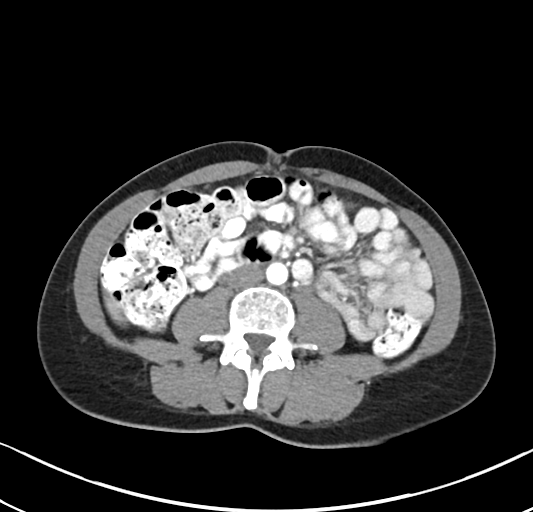
[im 51/83  soft-tissue]
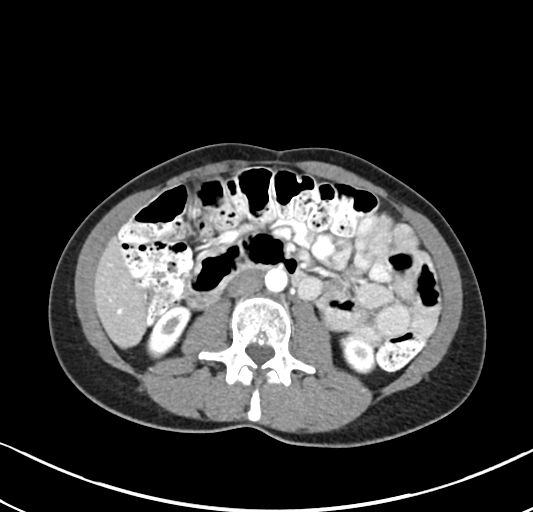
[im 60/83  soft-tissue]
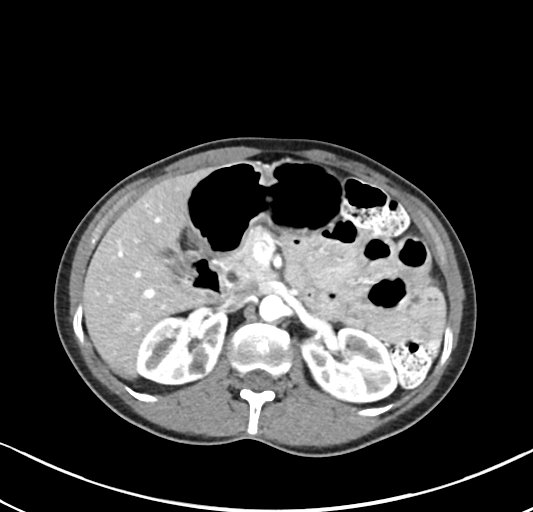
[im 60/83  bone]
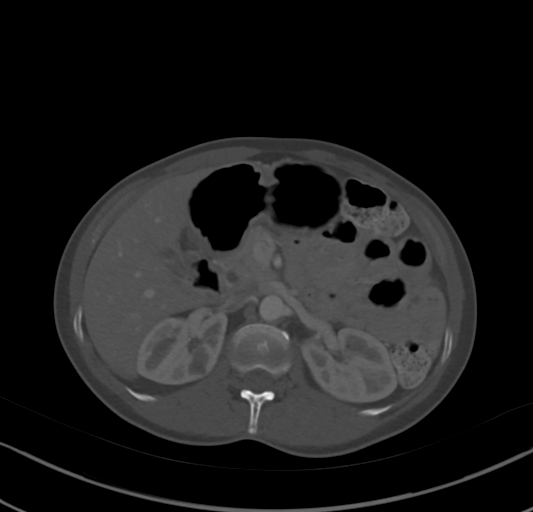
[im 64/83  soft-tissue]
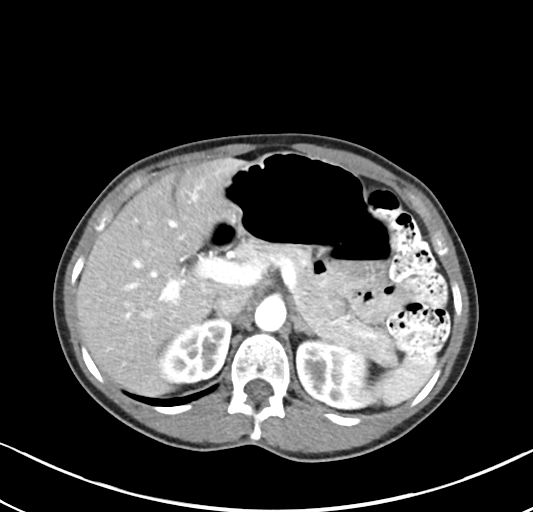
[im 69/83  soft-tissue]
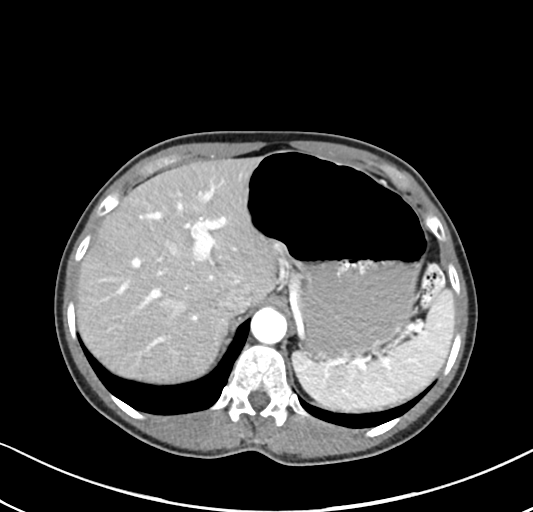
[im 78/83  soft-tissue]
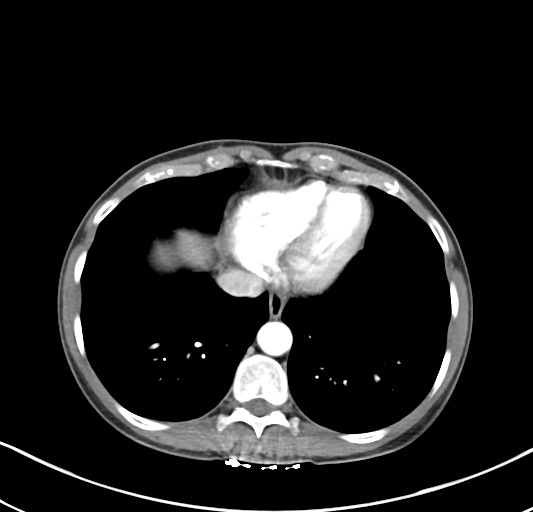

[Series 6: abd pelvis 2.00 br40 s3 cor · coronal · 0.68mm/px · 3 of 125 slices shown]
[im 42/125  soft-tissue]
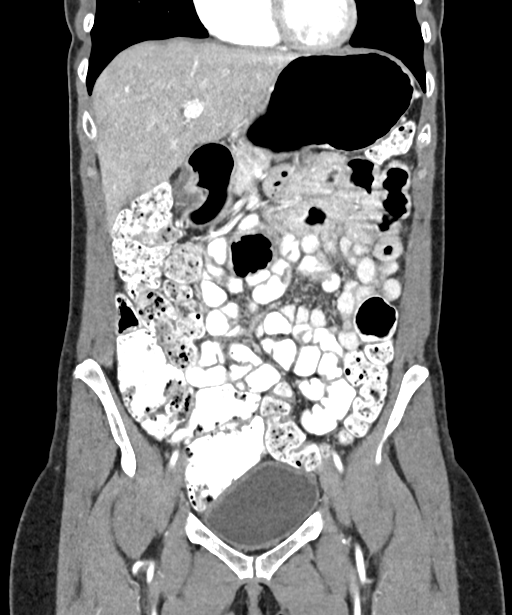
[im 56/125  soft-tissue]
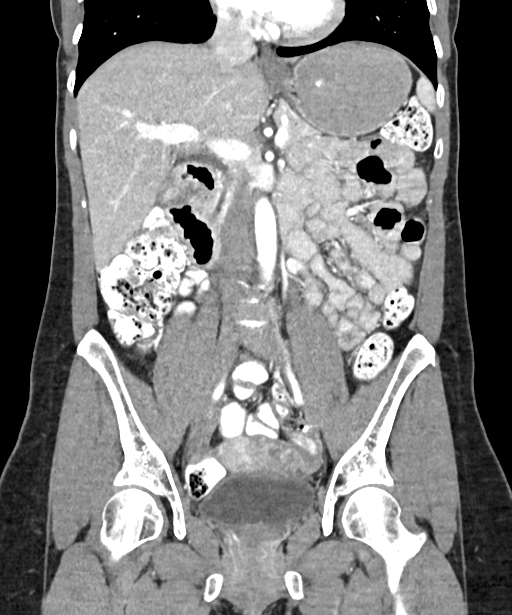
[im 69/125  soft-tissue]
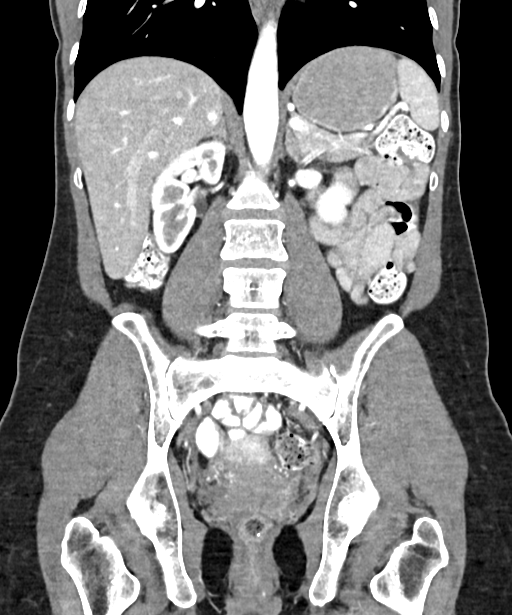

[15 of 46 positions shown; findings below may reference images not displayed]

FINDINGS: Lower chest: Lung bases are clear. Heart size normal. No pericardial
or pleural effusion.

Hepatobiliary: Subcentimeter low-attenuation lesion in the liver is
too small to characterize. Tiny hyperdense lesion in the medial
right hepatic lobe (series 2, image 16) is also too small to
characterize but may represent a perfusion anomaly or flash fill
hemangioma. Liver and gallbladder are otherwise unremarkable. Bile
duct measures up to approximately 7 mm, prominent for age. On
coronal imaging, there is intermediate density within the lower
common bile duct (series 6, image 56).

Pancreas: Negative.

Spleen: Negative.

Adrenals/Urinary Tract: Adrenal glands are unremarkable.
Low-attenuation lesions in the kidneys are subcentimeter in size and
too small to characterize. Statistically, cysts are most likely.
Ureters are decompressed. Bladder is grossly unremarkable.

Stomach/Bowel: Stomach, small bowel, appendix and colon are
unremarkable.

Vascular/Lymphatic: Vascular structures are unremarkable. No
pathologically enlarged lymph nodes.

Reproductive: Uterus is visualized and may contain fibroids. No
adnexal mass.

Other: Small pelvic free fluid. Mesenteries and peritoneum are
otherwise unremarkable.

Musculoskeletal: No worrisome lytic or sclerotic lesions.
IMPRESSION: Common bile duct dilatation with possible sludge or stone debris in
the lower common bile duct. Consider MR abdomen/MRCP without and
with contrast in further evaluation, as clinically indicated.

## 2019-03-27 DIAGNOSIS — Z01411 Encounter for gynecological examination (general) (routine) with abnormal findings: Secondary | ICD-10-CM | POA: Diagnosis not present

## 2019-03-27 DIAGNOSIS — Z6822 Body mass index (BMI) 22.0-22.9, adult: Secondary | ICD-10-CM | POA: Diagnosis not present

## 2019-03-27 DIAGNOSIS — E039 Hypothyroidism, unspecified: Secondary | ICD-10-CM | POA: Diagnosis not present

## 2019-03-27 DIAGNOSIS — R87612 Low grade squamous intraepithelial lesion on cytologic smear of cervix (LGSIL): Secondary | ICD-10-CM | POA: Diagnosis not present

## 2019-03-27 DIAGNOSIS — N951 Menopausal and female climacteric states: Secondary | ICD-10-CM | POA: Diagnosis not present

## 2019-04-10 DIAGNOSIS — G43709 Chronic migraine without aura, not intractable, without status migrainosus: Secondary | ICD-10-CM | POA: Diagnosis not present

## 2019-04-24 DIAGNOSIS — Z1231 Encounter for screening mammogram for malignant neoplasm of breast: Secondary | ICD-10-CM | POA: Diagnosis not present

## 2020-11-30 ENCOUNTER — Encounter: Payer: Self-pay | Admitting: Neurology

## 2021-01-15 ENCOUNTER — Other Ambulatory Visit: Payer: Self-pay | Admitting: Pain Medicine

## 2021-01-15 DIAGNOSIS — M25519 Pain in unspecified shoulder: Secondary | ICD-10-CM

## 2021-01-15 DIAGNOSIS — M542 Cervicalgia: Secondary | ICD-10-CM

## 2021-01-27 ENCOUNTER — Ambulatory Visit: Payer: 59 | Admitting: Neurology

## 2021-02-13 ENCOUNTER — Other Ambulatory Visit: Payer: Self-pay

## 2021-02-13 ENCOUNTER — Ambulatory Visit
Admission: RE | Admit: 2021-02-13 | Discharge: 2021-02-13 | Disposition: A | Discharge status: Home / Self Care | Payer: 59 | Source: Ambulatory Visit | Attending: Pain Medicine | Admitting: Pain Medicine

## 2021-02-13 DIAGNOSIS — M25519 Pain in unspecified shoulder: Secondary | ICD-10-CM

## 2021-02-13 DIAGNOSIS — M542 Cervicalgia: Secondary | ICD-10-CM

## 2021-02-13 MED ORDER — GADOBENATE DIMEGLUMINE 529 MG/ML IV SOLN
11.0000 mL | Freq: Once | INTRAVENOUS | Status: AC | PRN
  Administered 2021-02-13: 11 mL via INTRAVENOUS

## 2021-02-23 NOTE — Progress Notes (Signed)
NEUROLOGY FOLLOW UP OFFICE NOTE  Laurie Oneal [id number]  Assessment/Plan:   1.  Chronic migraine without aura 2.  Migraine with aura 3.  Chronic pain syndrome - with bilateral upper extremity pain and numbness - rule out other peripheral nerve etiologies such as carpal tunnel syndrome bilaterally and left ulnar neuropathy at elbow.  1.  Migraine prevention:  Continue Botox (next injection in May).  Will provide her samples of Nurtec to take daily for two weeks prior to next Botox.  If effective, will send prescription. 2.  Migraine rescue:  Stop Bernita Raisin - she will try Sprix NS.  Cannot be taken with Cambia.  She has adverse reaction to triptans. 3.  Zofran for nausea 4.  NCV-EMG upper extremities 5.  Limit use of pain relievers to no more than 2 days out of week to prevent risk of rebound or medication-overuse headache.  Advised to stop hydrocodone for treatment of headache. 6.  Keep headache diary 7.  Follow up 4 to 6 months.  Subjective:  Laurie Oneal is a 50 year old right-handed female with chronic pain syndrome and hypothyroidism and history of childhood seizures who presents for migraines.  History supplemented by prior neurologist's and referring provider's notes.  Onset:  Middle school Location:  Varies -bilateral frontal, temporal, occipital regions radiating to neck and shoulders bilaterally Quality:  Throbbing, sharp, stabbing, dull Intensity:  severe.  She denies new headache, thunderclap headache or severe headache that wakes her from sleep. Aura:  3 prior migraines associated with vision loss in right eye with aphasia (one time with memory recall and flashes in her vision) - lasted about 6 minutes followed by severe headache. Prodrome:  Stomach ache, foggy-headed/word-finding trouble Postdrome:  Fatigue, soreness Associated symptoms:  Nausea, vomiting, blurred vision, photophobia, phonophobia, osmophobia, eye pain.  She denies associated unilateral numbness  or weakness. Duration:  3 days  Frequency:  On Botox, she may have headache-free months but will start getting migraines 2 weeks prior to next injection. Triggers/aggravating factors:  Movement, bright light, change in weather, stress, menses Relieving factors:  Botox has worked the best Activity:  Headaches aggravated by movement  Patient has chronic neck pain.  She sees pain management  MRI of cervical spine from 02/14/2021 personally reviewed showed tiny central disc protrusions at C2-3 and C3-4 without significant stenosis or cord impingement.  Also noted were edema and enhancement involving the left posterior paraspinal muscles posterior to the left facets at C2 through C7 presumably related to recent radiofrequency ablation.  Otherwise unremarkable.  She continues to experience shooting pain down her arm with numbness in her 5th finger of left hand and when using her hands, may have numbness and cramping involving the entire hands bilaterally.  SShe has been evaluated by rheumatology and was told she may have fibromyalgia.    Remote CT of head from 04/02/2003 to evaluate headaches was negative.  Rescue protocol:  Hydrates.  Usually starts with ibuprofen or Ubrelvy.  Cambia would be second line.  If worsens, may take 1/2 hydrocodone.   Current NSAIDS/analgesics:  Cambia 50mg , hydrocodone (headache) Current triptans:  Adverse reaction Current ergotamine:  Adverse reaction Current anti-emetic:  Zofran ODT 4mg  Current muscle relaxants:  Tizanidine 4-8mg  QHS  Current Antihypertensive medications:  none Current Antidepressant medications:  Cymbalta 30mg  BID, sertraline 100mg  QHS Current Anticonvulsant medications:  zonisamide (currently taking 100mg  - tapering off from 200mg  - concern if it may contributing to her paresthesias) , Lyrica 75mg  QHS Current anti-CGRP:  Ubrelvy 100mg  Current Vitamins/Herbal/Supplements:  none Current Antihistamines/Decongestants:  none Other therapy:  Botox (last  injection 01/13/2021) Hormone/birth control:  Synthroid Other medications:  Buspirone, Synthroid, Alprazolam, Lunesta  Past NSAIDS/analgesics:  Midrin, Excedrin, Tylenol, Ultram, Vicodin, Advil, naproxen, Mobic, Toradol, Cambia, zipsor, Dilaudid, Percocet, Oxycontin Past abortive triptans:  Sumatriptan (adverse reaction), Amerge, Axert, Zomig tab Past abortive ergotamine:  Migranal Past muscle relaxants:  Flexeril, Robaxin, Skelaxin Past anti-emetic:  Thorazine, Zofran, promethazine Past antihypertensive medications:  none Past antidepressant medications:  imipramine Past anticonvulsant medications: topiramate, gabapentin Past anti-CGRP:  Ajovy (worsened headaches) Past vitamins/Herbal/Supplements:  none Past antihistamines/decongestants:  Benadryl, Zyrtec, Claritin, Allegra, Nasonex, Flonase Other past therapies:  Trigger point injections, bilateral cervical-radiofrequency ablations.  Caffeine:  1 cup of coffee daily.   Diet:  Does not drink enough water.  No soda.  Skips meals Exercise:  Yoga/pilates once a week.  Trying to go back to the gym. Depression:  yes; Anxiety:  yes Other pain:  Chronic generalized pain, cervical spondylosis with chronic neck pain.  Icy/burning pain in legs.   Sleep hygiene:  Poor.  On Ambien Family history of headache:  Mother, brother, son   PAST MEDICAL HISTORY: Past Medical History:  Diagnosis Date  . Headache(784.0)   . Hypothyroidism   . Seizures (HCC)    had sz as child and when they went away she started to have migrains    MEDICATIONS: Current Outpatient Medications on File Prior to Visit  Medication Sig Dispense Refill  . b complex vitamins tablet Take 1 tablet by mouth daily. Takes sporatically    . Cholecalciferol (VITAMIN D3) 125 MCG (5000 UT) CAPS Take 1 capsule by mouth daily.     01/15/2021 dicyclomine (BENTYL) 20 MG tablet Take 1 tablet (20 mg total) by mouth 3 (three) times daily as needed for spasms. 20 tablet 0  . gabapentin (NEURONTIN)  300 MG capsule Take 1 capsule (300 mg total) by mouth 2 (two) times daily. (Patient taking differently: Take 300 mg by mouth at bedtime as needed. ) 90 capsule 1  . GLUCOSAMINE PO Take 1 capsule by mouth daily.    Marland Kitchen ibuprofen (ADVIL,MOTRIN) 200 MG tablet Take 600 mg by mouth every 6 (six) hours as needed for headache.    . ketotifen (ALLERGY EYE DROPS) 0.025 % ophthalmic solution Place 1 drop into both eyes every morning.    Marland Kitchen levothyroxine (SYNTHROID, LEVOTHROID) 75 MCG tablet Take 37.5 mcg by mouth daily before breakfast.     . LORazepam (ATIVAN) 1 MG tablet Take 1 mg by mouth at bedtime as needed for sleep.    . Multiple Vitamin (MULTIVITAMIN WITH MINERALS) TABS Take 1 tablet by mouth daily.    . polyethylene glycol (MIRALAX) packet Take 17 g by mouth daily. 14 each 0  . sertraline (ZOLOFT) 100 MG tablet Take 100 mg by mouth at bedtime.    Marland Kitchen tiZANidine (ZANAFLEX) 4 MG tablet Take 4-8 mg by mouth at bedtime.     Marland Kitchen zolpidem (AMBIEN) 10 MG tablet Take 10 mg by mouth at bedtime as needed for sleep.    Marland Kitchen zonisamide (ZONEGRAN) 100 MG capsule Take 200 mg by mouth daily.      No current facility-administered medications on file prior to visit.    ALLERGIES: Allergies  Allergen Reactions  . Percocet [Oxycodone-Acetaminophen] Nausea And Vomiting and Other (See Comments)    migraine  . Sumatriptan     *feels like someone pouring acid on skull*  . Amoxicillin-Pot Clavulanate Hives  . Amoxicillin-Pot  Clavulanate Hives  . Erythromycin Other (See Comments)    Severe GI pains  . Latex   . Migranal [Dihydroergotamine] Nausea And Vomiting    Uncontrollable vomiting  . Penicillins Nausea And Vomiting    **SEVERE VOMITING Has patient had a PCN reaction causing immediate rash, facial/tongue/throat swelling, SOB or lightheadedness with hypotension: no Has patient had a PCN reaction causing severe rash involving mucus membranes or skin necrosis: no Has patient had a PCN reaction that required  hospitalization:  no Has patient had a PCN reaction occurring within the last 10 years: yes If all of the above answers are "NO", then may proceed with Cephalosporin use.     FAMILY HISTORY: No family history on file.    Objective:  Blood pressure 111/71, pulse 94, height 5\' 1"  (1.549 m), weight 128 lb 12.8 oz (58.4 kg), SpO2 99 %. General: No acute distress.  Patient appears well-groomed.   Head:  Normocephalic/atraumatic Eyes:  Fundi examined but not visualized Neck: supple, bilateral paraspinal tenderness, full range of motion Heart:  Regular rate and rhythm Lungs:  Clear to auscultation bilaterally Back: No paraspinal tenderness Neurological Exam: alert and oriented to person, place, and time. Attention span and concentration intact, recent and remote memory intact, fund of knowledge intact.  Speech fluent and not dysarthric, language intact.  CN II-XII intact. Bulk and tone normal, muscle strength 5/5 throughout.  Sensation to pinprick reduced on palmar side of hand and fingers.  Vibratory sensation intact.  Deep tendon reflexes 2+ throughout, toes downgoing.  Finger to nose and heel to shin testing intact.  Gait normal, Romberg negative.  Positive Tinel's sign at left elbow.  Tinel sign at wrists negative.   , DO  CC:  Shon Millet, MD  Farris Has, MD

## 2021-02-24 ENCOUNTER — Other Ambulatory Visit: Payer: Self-pay | Admitting: Neurology

## 2021-02-24 ENCOUNTER — Ambulatory Visit: Payer: 59 | Admitting: Neurology

## 2021-02-24 ENCOUNTER — Encounter: Payer: Self-pay | Admitting: Neurology

## 2021-02-24 ENCOUNTER — Other Ambulatory Visit: Payer: Self-pay

## 2021-02-24 VITALS — BP 111/71 | HR 94 | Ht 61.0 in | Wt 128.8 lb

## 2021-02-24 DIAGNOSIS — G43719 Chronic migraine without aura, intractable, without status migrainosus: Secondary | ICD-10-CM

## 2021-02-24 DIAGNOSIS — G43109 Migraine with aura, not intractable, without status migrainosus: Secondary | ICD-10-CM | POA: Diagnosis not present

## 2021-02-24 DIAGNOSIS — R200 Anesthesia of skin: Secondary | ICD-10-CM | POA: Diagnosis not present

## 2021-02-24 DIAGNOSIS — R202 Paresthesia of skin: Secondary | ICD-10-CM

## 2021-02-24 MED ORDER — KETOROLAC TROMETHAMINE 15.75 MG/SPRAY NA SOLN: NASAL | 5 refills | Status: DC

## 2021-02-24 NOTE — Patient Instructions (Addendum)
1.  Continue Botox - will schedule for May 18  2.  Beginning 2 weeks prior to next Botox injection, start taking Nurtec 75mg  daily for 2 weeks.  If effective, contact me for prescription. 3.  At earliest onset of migraine, take Sprix nasal spray - 1 spray in each nostril every 6 to 8 hours (maximum 4 doses in 24 hours). If taken, cannot take cambia.   4.  Zofran for nausea 5.  NCV-EMG upper extremities 6.  Follow up 4 to 6 months

## 2021-02-25 ENCOUNTER — Encounter: Payer: Self-pay | Admitting: Neurology

## 2021-02-25 NOTE — Telephone Encounter (Signed)
I have submitted a PA for this patient and insurance denied the request. So I faxed an appeal with the info Dr. Everlena Cooper stated as well as recent OV notes. I will let you know once I hear back from Appeal. Thanks!

## 2021-02-25 NOTE — Telephone Encounter (Signed)
This will definitely need a PA.  She cannot take triptans due to significant side effects.  She has failed Ubrelvy, Midrin, Excedrin, Tylenol, Ultram, Vicodin, Advil, naproxen, Mobic, Toradol, Cambia, zipsor, Dilaudid, Percocet, Oxycontin.  If still denied, then we will need to find out about a copay card.

## 2021-02-25 NOTE — Progress Notes (Addendum)
Per BV patient does require PA for Botox- SP is [facility]. Sent BV to scanning for chart.   3/31- submitted PA through [facility]   Called and set up account and gave verbal script for the patient's botox.

## 2021-02-25 NOTE — Progress Notes (Signed)
Laurie Oneal Key: [name] - PA Case ID: [id number] Need help? Call us at [phone] Outcome Approvedtoday Request Reference Number: [id number]. BOTOX INJ 200UNIT is approved through 05/27/2021. Your patient may now fill this prescription and it will be covered. Drug Botox 200UNIT solution Form [facility] Electronic Prior Authorization Form (2017 NCPDP)

## 2021-03-01 ENCOUNTER — Encounter: Payer: Self-pay | Admitting: Neurology

## 2021-03-01 NOTE — Progress Notes (Signed)
Received approval on the appeal for the ketorolac medication valid from 02/25/21 to 02/25/22. REF #: [id number].

## 2021-03-01 NOTE — Telephone Encounter (Signed)
Should I call the pharmacy?

## 2021-03-01 NOTE — Telephone Encounter (Signed)
Appeal has been overturned and medication has been approved.

## 2021-03-02 ENCOUNTER — Telehealth: Payer: Self-pay | Admitting: Neurology

## 2021-03-02 NOTE — Telephone Encounter (Signed)
I called patient to schedule her Botox appointment and you are booking out until 7/8 for Botox. She said she had an appt for 5/19 with her other neurologist and didn't know if she could keep that appt or if we could work her in on one of your regular days. Do you want me to work her in somewhere on 5/19 or after for Botox?

## 2021-03-02 NOTE — Telephone Encounter (Signed)
I already called

## 2021-03-03 NOTE — Telephone Encounter (Signed)
We can work her in on a regular day around that time

## 2021-03-19 ENCOUNTER — Ambulatory Visit: Payer: 59 | Admitting: Neurology

## 2021-03-22 ENCOUNTER — Other Ambulatory Visit: Payer: Self-pay | Admitting: Neurology

## 2021-03-23 NOTE — Telephone Encounter (Signed)
No answer at 346

## 2021-03-23 NOTE — Telephone Encounter (Signed)
It appears that the Sprix nasal spray is no longer available.  If patient is agreeable, we can send in a prescription for Nurtec - a dissolvable tablet that you put under your tongue - maximum 1 tablet in 24 hours.

## 2021-03-25 ENCOUNTER — Other Ambulatory Visit: Payer: Self-pay | Admitting: Neurology

## 2021-03-25 MED ORDER — NURTEC 75 MG PO TBDP: 75.0000 mg | ORAL_TABLET | Freq: Every day | ORAL | 5 refills | Status: DC | PRN

## 2021-03-25 NOTE — Telephone Encounter (Signed)
Patient would like to try this, please send in new Rx. thanks

## 2021-03-29 ENCOUNTER — Encounter: Payer: Self-pay | Admitting: Neurology

## 2021-03-29 NOTE — Progress Notes (Signed)
Laurie Oneal (Key: [id number]) Rx #: [id number] Nurtec 75MG  dispersible tablets   Form [facility] Electronic Prior Authorization Form (2017 NCPDP) Created 4 days ago Sent to Plan 3 days ago Plan Response 3 days ago Submit Clinical Questions 3 days ago Determination Favorable 3 days ago Message from Plan Request Reference Number: [id number]. NURTEC TAB 75MG  ODT is approved through 09/25/2021. Your patient may now fill this prescription and it will be covered.

## 2021-04-14 NOTE — Progress Notes (Signed)
Telephone call to [facility] pharmacy, To have botox delivered.  Per Rep Mrs. Jean Rosenthal Botox was delivered already on 04/01/2020 at 9;17 am.  Advise rep the botox was delivered to [place]. Please verify address of delivery.   Per rep botox delivered to [facility] Dr. Alethia Berthold, Her MA April M. Called to have it delivered.  Advise the Rep script was written by [facility] and Verbal script given over the phone on 02/25/21.  Rep unsure how and why the Botox was delivered to the wrong office.    Tried calling pt no answer. [employer] message sent to pt as well asking pt to give the office a call to let us know is she received her botox already and if she contuines to go to [place] or here at our office.   Pt will need to call [facility] as well.

## 2021-04-15 ENCOUNTER — Ambulatory Visit: Payer: 59 | Admitting: Neurology

## 2021-04-15 NOTE — Telephone Encounter (Signed)
Patient called in stating she was trying to speak with someone, but they could not hear her. She said to give her a call at her office at [phone].

## 2021-04-16 ENCOUNTER — Ambulatory Visit (INDEPENDENT_AMBULATORY_CARE_PROVIDER_SITE_OTHER): Payer: 59 | Admitting: Neurology

## 2021-04-16 ENCOUNTER — Other Ambulatory Visit: Payer: Self-pay

## 2021-04-16 DIAGNOSIS — G43719 Chronic migraine without aura, intractable, without status migrainosus: Secondary | ICD-10-CM | POA: Diagnosis not present

## 2021-04-16 MED ORDER — ONABOTULINUMTOXINA 100 UNITS IJ SOLR
200.0000 [IU] | Freq: Once | INTRAMUSCULAR | Status: AC
  Administered 2021-04-16: 155 [IU] via INTRAMUSCULAR

## 2021-04-16 NOTE — Progress Notes (Signed)
Bot[facility]um Clinic   Procedure Note Botox  Attending: Dr. Shon Millet  Preoperative Diagnosis(es): Chronic migraine  Consent obtained from: The patient Benefits discussed included, but were not limited to decreased muscle tightness, increased joint range of motion, and decreased pain.  Risk discussed included, but were not limited pain and discomfort, bleeding, bruising, excessive weakness, venous thrombosis, muscle atrophy and dysphagia.  Anticipated outcomes of the procedure as well as he risks and benefits of the alternatives to the procedure, and the roles and tasks of the personnel to be involved, were discussed with the patient, and the patient consents to the procedure and agrees to proceed. A copy of the patient medication guide was given to the patient which explains the blackbox warning.  Patients identity and treatment sites confirmed: yes.  Details of Procedure: Skin was cleaned with alcohol. Prior to injection, the needle plunger was aspirated to make sure the needle was not within a blood vessel.  There was no blood retrieved on aspiration.    Following is a summary of the muscles injected  And the amount of Botulinum toxin used:  Dilution 200 units of Botox was reconstituted with 4 ml of preservative free normal saline. Time of reconstitution: At the time of the office visit (<30 minutes prior to injection)   Injections  155 total units of Botox was injected with a 30 gauge needle.  Injection Sites: L occipitalis: 15 units- 3 sites  R occiptalis: 15 units- 3 sites  L upper trapezius: 15 units- 3 sites R upper trapezius: 15 units- 3 sits          L paraspinal: 10 units- 2 sites R paraspinal: 10 units- 2 sites  Face L frontalis(2 injection sites):10 units   R frontalis(2 injection sites):10 units         L corrugator: 5 units   R corrugator: 5 units           Procerus: 5 units   L temporalis: 20 units R temporalis: 20 units   Agent:  200 units of botulinum Type A  (Onobotulinum Toxin type A) was reconstituted with 4 ml of preservative free normal saline.  Time of reconstitution: At the time of the office visit (<30 minutes prior to injection)     Total injected (Units): 155  Total wasted (Units): 45  Patient tolerated procedure well without complications.   Reinjection is anticipated in 3 months. Return to clinic in 5 months

## 2021-04-21 ENCOUNTER — Encounter: Payer: 59 | Admitting: Neurology

## 2021-04-27 ENCOUNTER — Ambulatory Visit: Payer: 59 | Admitting: Neurology

## 2021-04-27 ENCOUNTER — Encounter: Payer: 59 | Admitting: Neurology

## 2021-04-27 ENCOUNTER — Other Ambulatory Visit: Payer: Self-pay

## 2021-04-27 DIAGNOSIS — R200 Anesthesia of skin: Secondary | ICD-10-CM | POA: Diagnosis not present

## 2021-04-27 DIAGNOSIS — R202 Paresthesia of skin: Secondary | ICD-10-CM

## 2021-04-27 NOTE — Procedures (Signed)
[facility]  [address] [place], [address]  [place], [place] [address] Tel: [phone] Fax:  [phone] Test Date:  04/27/2021  Patient: Laurie Oneal DOB: 06-01-1971 Physician: Nita Sickle, DO  Sex: Female Height: 5\' 1"  Ref Phys: , D.O.  ID#: Shon Millet   Technician:    Patient Complaints: This is a 50 year old female referred for evaluation of bilateral arm pain and numbness.  NCV & EMG Findings: Extensive electrodiagnostic testing of the right upper extremity and additional studies of the left shows: 1. Bilateral median, ulnar, and mixed palmar sensory responses are within normal limits. 2. Bilateral median and ulnar motor responses are within normal limits. 3. There is no evidence of active or chronic motor axonal loss changes affecting any of the tested muscles.  Motor unit configuration and recruitment pattern is within normal limits. . Impression: This is a normal study of the upper extremities.  In particular, there is no evidence of carpal tunnel syndrome or a cervical radiculopathy.   ___________________________ 44, DO    Nerve Conduction Studies Anti Sensory Summary Table   Stim Site NR Peak (ms) Norm Peak (ms) P-T Amp (V) Norm P-T Amp  Left Median Anti Sensory (2nd Digit)  34C  Wrist    2.8 <3.6 74.4 >15  Right Median Anti Sensory (2nd Digit)  34C  Wrist    3.0 <3.6 73.2 >15  Left Ulnar Anti Sensory (5th Digit)  34C  Wrist    2.7 <3.1 46.4 >10  Right Ulnar Anti Sensory (5th Digit)  34C  Wrist    2.5 <3.1 49.0 >10   Motor Summary Table   Stim Site NR Onset (ms) Norm Onset (ms) O-P Amp (mV) Norm O-P Amp Site1 Site2 Delta-0 (ms) Dist (cm) Vel (m/s) Norm Vel (m/s)  Left Median Motor (Abd Poll Brev)  34C  Wrist    2.8 <4.0 10.7 >6 Elbow Wrist 4.6 26.0 57 >50  Elbow    7.4  10.3         Right Median Motor (Abd Poll Brev)  34C  Wrist    3.0 <4.0 10.2 >6 Elbow Wrist 4.3 25.0 58 >50  Elbow    7.3  9.6         Left Ulnar  Motor (Abd Dig Minimi)  34C  Wrist    1.7 <3.1 11.4 >7 B Elbow Wrist 3.2 21.0 66 >50  B Elbow    4.9  10.7  A Elbow B Elbow 1.5 10.0 67 >50  A Elbow    6.4  10.3         Right Ulnar Motor (Abd Dig Minimi)  34C  Wrist    2.0 <3.1 11.2 >7 B Elbow Wrist 3.0 21.0 70 >50  B Elbow    5.0  10.6  A Elbow B Elbow 1.5 10.0 67 >50  A Elbow    6.5  10.5          Comparison Summary Table   Stim Site NR Peak (ms) Norm Peak (ms) P-T Amp (V) Site1 Site2 Delta-P (ms) Norm Delta (ms)  Left Median/Ulnar Palm Comparison (Wrist - 8cm)  34C  Median Palm    1.6 <2.2 83.5 Median Palm Ulnar Palm 0.1   Ulnar Palm    1.5 <2.2 35.0      Right Median/Ulnar Palm Comparison (Wrist - 8cm)  34C  Median Palm    1.8 <2.2 79.4 Median Palm Ulnar Palm 0.3   Ulnar Palm    1.5 <2.2 31.3  EMG   Side Muscle Ins Act Fibs Psw Fasc Number Recrt Dur Dur. Amp Amp. Poly Poly. Comment  Right 1stDorInt Nml Nml Nml Nml Nml Nml Nml Nml Nml Nml Nml Nml N/A  Right PronatorTeres Nml Nml Nml Nml Nml Nml Nml Nml Nml Nml Nml Nml N/A  Right Biceps Nml Nml Nml Nml Nml Nml Nml Nml Nml Nml Nml Nml N/A  Right Triceps Nml Nml Nml Nml Nml Nml Nml Nml Nml Nml Nml Nml N/A  Right Deltoid Nml Nml Nml Nml Nml Nml Nml Nml Nml Nml Nml Nml N/A  Left 1stDorInt Nml Nml Nml Nml Nml Nml Nml Nml Nml Nml Nml Nml N/A  Left PronatorTeres Nml Nml Nml Nml Nml Nml Nml Nml Nml Nml Nml Nml N/A  Left Biceps Nml Nml Nml Nml Nml Nml Nml Nml Nml Nml Nml Nml N/A  Left Triceps Nml Nml Nml Nml Nml Nml Nml Nml Nml Nml Nml Nml N/A  Left Deltoid Nml Nml Nml Nml Nml Nml Nml Nml Nml Nml Nml Nml N/A      Waveforms:

## 2021-06-30 ENCOUNTER — Telehealth: Payer: Self-pay | Admitting: Neurology

## 2021-06-30 NOTE — Telephone Encounter (Signed)
Pt called in and would like to know if she needs to call [facility] for a refill or does the office do it. You can leave a mess on her vm

## 2021-07-01 NOTE — Telephone Encounter (Signed)
Lmovm for pt, Already sent refills to [facility]. Looks like pt needs a new PA for Botox last one expired 04/2021.

## 2021-07-02 ENCOUNTER — Encounter: Payer: Self-pay | Admitting: *Deleted

## 2021-07-02 NOTE — Progress Notes (Signed)
Requested the form needed to fill out PA from [employer] once it is uploaded on the website I will fill out and submit  Atonya, [place] 07/02/2021 [facility] Form Request [facility] Shon Millet Requested

## 2021-07-07 ENCOUNTER — Telehealth: Payer: Self-pay | Admitting: *Deleted

## 2021-07-07 NOTE — Telephone Encounter (Signed)
LMOM thatr Bs arrived from the specialty pharmacy and everything is ready and good to go so we will see her on her next appt.

## 2021-07-16 ENCOUNTER — Ambulatory Visit: Payer: 59 | Admitting: Neurology

## 2021-07-16 ENCOUNTER — Other Ambulatory Visit: Payer: Self-pay

## 2021-07-16 DIAGNOSIS — G43109 Migraine with aura, not intractable, without status migrainosus: Secondary | ICD-10-CM

## 2021-07-16 MED ORDER — ONABOTULINUMTOXINA 100 UNITS IJ SOLR
200.0000 [IU] | Freq: Once | INTRAMUSCULAR | Status: AC
  Administered 2021-07-16: 155 [IU] via INTRAMUSCULAR

## 2021-07-16 NOTE — Progress Notes (Signed)
[facility]ulinum Clinic  ° °Procedure Note Botox ° °Attending: Dr. Mathews Stuhr ° °Preoperative Diagnosis(es): Chronic migraine ° °Consent obtained from: The patient °Benefits discussed included, but were not limited to decreased muscle tightness, increased joint range of motion, and decreased pain.  Risk discussed included, but were not limited pain and discomfort, bleeding, bruising, excessive weakness, venous thrombosis, muscle atrophy and dysphagia.  Anticipated outcomes of the procedure as well as he risks and benefits of the alternatives to the procedure, and the roles and tasks of the personnel to be involved, were discussed with the patient, and the patient consents to the procedure and agrees to proceed. A copy of the patient medication guide was given to the patient which explains the blackbox warning. ° °Patients identity and treatment sites confirmed Yes.  . ° °Details of Procedure: °Skin was cleaned with alcohol. Prior to injection, the needle plunger was aspirated to make sure the needle was not within a blood vessel.  There was no blood retrieved on aspiration.   ° °Following is a summary of the muscles injected  And the amount of Botulinum toxin used: ° °Dilution °200 units of Botox was reconstituted with 4 ml of preservative free normal saline. °Time of reconstitution: At the time of the office visit (<30 minutes prior to injection)  ° °Injections  °155 total units of Botox was injected with a 30 gauge needle. ° °Injection Sites: °L occipitalis: 15 units- 3 sites  °R occiptalis: 15 units- 3 sites ° °L upper trapezius: 15 units- 3 sites °R upper trapezius: 15 units- 3 sits          °L paraspinal: 10 units- 2 sites °R paraspinal: 10 units- 2 sites ° °Face °L frontalis(2 injection sites):10 units   °R frontalis(2 injection sites):10 units         °L corrugator: 5 units   °R corrugator: 5 units           °Procerus: 5 units   °L temporalis: 20 units °R temporalis: 20 units  ° °Agent:  °200 units of botulinum Type  A (Onobotulinum Toxin type A) was reconstituted with 4 ml of preservative free normal saline.  °Time of reconstitution: At the time of the office visit (<30 minutes prior to injection)  ° ° ° Total injected (Units):  155 ° Total wasted (Units):  45 ° °Patient tolerated procedure well without complications.   °Reinjection is anticipated in 3 months. ° ° °

## 2021-08-31 ENCOUNTER — Telehealth: Payer: Self-pay

## 2021-08-31 NOTE — Telephone Encounter (Signed)
Laurie Oneal (Key: [id number]) Nurtec 75MG  dispersible tablets   Form [facility] Electronic Prior Authorization Form (2017 NCPDP) Created 3 days ago Sent to Plan 9 minutes ago Plan Response 8 minutes ago Submit Clinical Questions 1 minute ago Determination Wait for Determination Please wait for [facility] 2017 NCPDP to return a determination.

## 2021-09-01 ENCOUNTER — Telehealth: Payer: Self-pay

## 2021-09-01 NOTE — Telephone Encounter (Signed)
New message   Laurie Oneal Key: [id number] - PA Case ID: [id number] Need help? Call us at [phone]  Status Sent to Plan today  Drug Botox 200UNIT solution Form [facility] Electronic Prior Authorization Form (2017 NCPDP)

## 2021-09-01 NOTE — Telephone Encounter (Signed)
F/u   Laurie Oneal (Key: [id number]) Nurtec 75MG  dispersible tablets   Form [facility] Electronic Prior Authorization Form (2017 NCPDP) Created 4 days ago Sent to Plan 1 day ago Plan Response 1 day ago Submit Clinical Questions 1 day ago Determination Favorable 22 hours ago Message from Plan Request Reference Number: [id number]. NURTEC TAB 75MG  ODT is approved through 08/31/2022. Your patient may now fill this prescription and it will be covered.

## 2021-09-01 NOTE — Telephone Encounter (Addendum)
F/u   Outcome Approved today  Request Reference Number: [id number]. BOTOX INJ 200UNIT is approved through 12/02/2021. Your patient may now fill this prescription and it will be covered.  Drug Botox 200UNIT solution  The patient contacted at  [phone] left a voicemail of Botox approval to contact her specialty pharmacy to get consent on file and approval and delivery to the office.

## 2021-09-06 NOTE — Progress Notes (Signed)
NEUROLOGY FOLLOW UP OFFICE NOTE  COLBY CATANESE [id number]  Assessment/Plan:   Migraine with aura, without status migrainosus, not intractable Chronic pain syndrome   Migraine prevention:  Botox Nurtec daily for last 2 weeks prior to Botox Migraine rescue:  Try Reyvow.  Advised no driving for 8 hours after use. Limit use of pain relievers to no more than 2 days out of week to prevent risk of rebound or medication-overuse headache. Keep headache diary Refer to Dr. Harold Hedge for pain management Follow up for next Botox.  Follow up for routine office visit in one year   Subjective:  Tawni Melkonian is a 50 year old right-handed female with chronic pain syndrome and hypothyroidism and history of childhood seizures who follows up for chronic migraine.  UPDATE: As she tends to have increased migraines the last two weeks prior to Botox, she was given Nurtec to take daily for those last 2 weeks between Botox.  She hasn't used it as a periprophylaxis yet but has taken it as needed for a couple of migraines which have helped.  Averages 2 migraines during the third month before Botox.  She was prescribed Sprix but it may no longer be available.    For evaluation of chronic pain, she had NCV-EMG on 04/27/2021 which was normal.  She would like a referral to pain management.  Dr. Harold Hedge was recommended by a friend but requires a referral.  Rescue protocol:  Hydrates.  Ubrelvy and ibuprofen.  If worsens, may take 1/2 hydrocodone.   Current NSAIDS/analgesics:  Cambia 50mg , hydrocodone (headache) Current triptans:  Adverse reaction Current ergotamine:  Adverse reaction Current anti-emetic:  Zofran ODT 4mg  Current muscle relaxants:  Tizanidine 4-8mg  QHS  Current Antihypertensive medications:  none Current Antidepressant medications:  Cymbalta 30mg  BID, sertraline 100mg  QHS Current Anticonvulsant medications:  zonisamide (currently taking 100mg  - tapering off from 200mg  -  concern if it may contributing to her paresthesias) , Lyrica 75mg  QHS Current anti-CGRP:  Nurtec, Ubrelvy (still has some left over) Current Vitamins/Herbal/Supplements:  none Current Antihistamines/Decongestants:  none Other therapy:  Botox (last injection 01/13/2021) Hormone/birth control:  Synthroid Other medications:  Buspirone, Synthroid, Alprazolam, Lunesta  Caffeine:  1 cup of coffee daily.   Diet:  Does not drink enough water.  No soda.  Skips meals Exercise:  Yoga/pilates once a week.  Trying to go back to the gym. Depression:  yes; Anxiety:  yes Other pain:  Chronic generalized pain, cervical spondylosis with chronic neck pain.  Icy/burning pain in legs.   Sleep hygiene:  Poor.  On Ambien   HISTORY: Onset:  Middle school Location:  Varies -bilateral frontal, temporal, occipital regions radiating to neck and shoulders bilaterally Quality:  Throbbing, sharp, stabbing, dull Initial Intensity:  severe.  She denies new headache, thunderclap headache or severe headache that wakes her from sleep. Aura:  3 prior migraines associated with vision loss in right eye with aphasia (one time with memory recall and flashes in her vision) - lasted about 6 minutes followed by severe headache. Prodrome:  Stomach ache, foggy-headed/word-finding trouble Postdrome:  Fatigue, soreness Associated symptoms:  Nausea, vomiting, blurred vision, photophobia, phonophobia, osmophobia, eye pain.  She denies associated unilateral numbness or weakness. Initial Duration:  3 days  Initial Frequency:  On Botox, she may have headache-free months but will start getting migraines 2 weeks prior to next injection. Triggers/aggravating factors:  Movement, bright light, change in weather, stress, menses Relieving factors:  Botox has worked the best Activity:  Headaches aggravated by movement   Patient has chronic neck pain.  She sees pain management  MRI of cervical spine from 02/14/2021 personally reviewed showed tiny  central disc protrusions at C2-3 and C3-4 without significant stenosis or cord impingement.  Also noted were edema and enhancement involving the left posterior paraspinal muscles posterior to the left facets at C2 through C7 presumably related to recent radiofrequency ablation.  Otherwise unremarkable.  She continues to experience shooting pain down her arm with numbness in her 5th finger of left hand and when using her hands, may have numbness and cramping involving the entire hands bilaterally.  SShe has been evaluated by rheumatology and was told she may have fibromyalgia.     Remote CT of head from 04/02/2003 to evaluate headaches was negative.    Past NSAIDS/analgesics:  Midrin, Excedrin, Tylenol, Ultram, Vicodin, Advil, naproxen, Mobic, Toradol, Cambia, zipsor, Dilaudid, Percocet, Oxycontin Past abortive triptans:  Sumatriptan (adverse reaction), Amerge, Axert, Zomig tab Past abortive ergotamine:  Migranal Past muscle relaxants:  Flexeril, Robaxin, Skelaxin Past anti-emetic:  Thorazine, Zofran, promethazine Past antihypertensive medications:  none Past antidepressant medications:  imipramine Past anticonvulsant medications: topiramate, gabapentin Past anti-CGRP:  Ajovy (worsened headaches) Past vitamins/Herbal/Supplements:  none Past antihistamines/decongestants:  Benadryl, Zyrtec, Claritin, Allegra, Nasonex, Flonase Other past therapies:  Trigger point injections, bilateral cervical-radiofrequency ablations.    Family history of headache:  Mother, brother, son  PAST MEDICAL HISTORY: Past Medical History:  Diagnosis Date   Fibromyalgia 2018   Headache(784.0)    Hypothyroidism    Seizures (HCC)    had sz as child and when they went away she started to have migrains    MEDICATIONS: Current Outpatient Medications on File Prior to Visit  Medication Sig Dispense Refill   b complex vitamins tablet Take 1 tablet by mouth daily. Takes sporatically     baclofen (LIORESAL) 10 MG tablet  Take 10 mg by mouth 2 (two) times daily as needed.     busPIRone (BUSPAR) 15 MG tablet Take 15 mg by mouth 2 (two) times daily as needed. Take 1/2 tab     Cholecalciferol (VITAMIN D3) 125 MCG (5000 UT) CAPS Take 1 capsule by mouth daily.      Diclofenac Potassium,Migraine, (CAMBIA) 50 MG PACK Take 50 tablets by mouth as needed.     DULoxetine (CYMBALTA) 30 MG capsule Take 90 mg by mouth every morning.     DULoxetine (CYMBALTA) 30 MG capsule Take 90 mg by mouth every morning.     Eszopiclone 3 MG TABS Take 3 mg by mouth daily.     gabapentin (NEURONTIN) 300 MG capsule Take 1 capsule (300 mg total) by mouth 2 (two) times daily. (Patient not taking: Reported on 02/24/2021) 90 capsule 1   GLUCOSAMINE PO Take 1 capsule by mouth daily.     ibuprofen (ADVIL,MOTRIN) 200 MG tablet Take 600 mg by mouth every 6 (six) hours as needed for headache. (Patient not taking: Reported on 02/24/2021)     Ketorolac Tromethamine (SPRIX) 15.75 MG/SPRAY SOLN 1 spray in each nostril every 6-8 hours.  Maximum 4 doses in 24 hours 5 each 5   ketotifen (ZADITOR) 0.025 % ophthalmic solution Place 1 drop into both eyes every morning.     LORazepam (ATIVAN) 1 MG tablet Take 1 mg by mouth at bedtime as needed for sleep.     Multiple Vitamin (MULTIVITAMIN WITH MINERALS) TABS Take 1 tablet by mouth daily.     ondansetron (ZOFRAN) 8 MG tablet Take 4 mg by  mouth as needed.     polyethylene glycol (MIRALAX) packet Take 17 g by mouth daily. (Patient not taking: Reported on 02/24/2021) 14 each 0   pregabalin (LYRICA) 75 MG capsule Take 75 mg by mouth at bedtime.     Rimegepant Sulfate (NURTEC) 75 MG TBDP Take 75 mg by mouth daily as needed (Maximum 1 tablet in 24 hours). 8 tablet 5   SYNTHROID 50 MCG tablet Take 50 mcg by mouth daily.     tiZANidine (ZANAFLEX) 4 MG tablet Take 4-8 mg by mouth at bedtime.     tiZANidine (ZANAFLEX) 4 MG tablet Take 1 tablet by mouth every 8 (eight) hours as needed. (Patient not taking: Reported on 02/24/2021)      zonisamide (ZONEGRAN) 100 MG capsule Take 200 mg by mouth daily.      No current facility-administered medications on file prior to visit.    ALLERGIES: Allergies  Allergen Reactions   Percocet [Oxycodone-Acetaminophen] Nausea And Vomiting and Other (See Comments)    migraine   Sumatriptan     *feels like someone pouring acid on skull*   Amoxicillin-Pot Clavulanate Hives   Amoxicillin-Pot Clavulanate Hives   Erythromycin Other (See Comments)    Severe GI pains   Latex    Migranal [Dihydroergotamine] Nausea And Vomiting    Uncontrollable vomiting   Penicillins Nausea And Vomiting    **SEVERE VOMITING Has patient had a PCN reaction causing immediate rash, facial/tongue/throat swelling, SOB or lightheadedness with hypotension: no Has patient had a PCN reaction causing severe rash involving mucus membranes or skin necrosis: no Has patient had a PCN reaction that required hospitalization:  no Has patient had a PCN reaction occurring within the last 10 years: yes If all of the above answers are "NO", then may proceed with Cephalosporin use.     FAMILY HISTORY: Family History  Problem Relation Age of Onset   Seizures Mother    Migraines Mother    Hypertension Father    Migraines Brother    Seizures Child    Migraines Child       Objective:  Blood pressure 118/62, pulse (!) 101, height 5' (1.524 m), weight 126 lb 12.8 oz (57.5 kg), SpO2 100 %. General: No acute distress.  Patient appears well-groomed.    Shon Millet, DO  CC: Farris Has, MD

## 2021-09-08 ENCOUNTER — Other Ambulatory Visit: Payer: Self-pay

## 2021-09-08 ENCOUNTER — Encounter: Payer: Self-pay | Admitting: Neurology

## 2021-09-08 ENCOUNTER — Ambulatory Visit: Payer: 59 | Admitting: Neurology

## 2021-09-08 VITALS — BP 118/62 | HR 101 | Ht 60.0 in | Wt 126.8 lb

## 2021-09-08 DIAGNOSIS — G43109 Migraine with aura, not intractable, without status migrainosus: Secondary | ICD-10-CM

## 2021-09-08 DIAGNOSIS — G894 Chronic pain syndrome: Secondary | ICD-10-CM

## 2021-09-08 NOTE — Progress Notes (Signed)
Medication Samples have been provided to the patient.  Drug name: Reyvow       Strength: 100 mg        Qty: 2  LOT: [id number]  Exp.Date: 07/2022  Dosing instructions: as needed for a Migraine.   The patient has been instructed regarding the correct time, dose, and frequency of taking this medication, including desired effects and most common side effects.   Leida Lauth 10:11 AM 09/08/2021

## 2021-09-08 NOTE — Patient Instructions (Signed)
Continue Botox every 3 months Use Nurtec daily for last 2 weeks prior to Botox Try Reyvow when you get a migraine.  Take 100mg  (maximum 1 tablet in 24 hours).  If effective, contact me for prescription.  No driving for 8 hours after use. Will refer you to Dr. for pain management Follow up for Botox.  Follow up for routine office visit in one year.

## 2021-09-15 ENCOUNTER — Encounter: Payer: Self-pay | Admitting: Physical Medicine & Rehabilitation

## 2021-09-17 ENCOUNTER — Telehealth: Payer: Self-pay

## 2021-09-17 NOTE — Telephone Encounter (Signed)
F/u   I called [facility] [phone] spoke with [facility].   Consent on File per [facility] .   Botox 200 unit   Confirmation date  Wednesday, October 26,2022 with a signature required.     Office Address confirm with [facility].

## 2021-09-17 NOTE — Telephone Encounter (Signed)
New message   I called & spoke with the patient at [phone] and advised the patient to contact her [facility] at [phone] for Botox consent delivery to the office for an upcoming appt on 10/15/21  with Dr. Everlena Cooper.

## 2021-10-15 ENCOUNTER — Ambulatory Visit: Payer: 59 | Admitting: Neurology

## 2021-10-15 ENCOUNTER — Other Ambulatory Visit: Payer: Self-pay

## 2021-10-15 DIAGNOSIS — G43109 Migraine with aura, not intractable, without status migrainosus: Secondary | ICD-10-CM

## 2021-10-15 MED ORDER — ONABOTULINUMTOXINA 100 UNITS IJ SOLR
200.0000 [IU] | Freq: Once | INTRAMUSCULAR | Status: AC
  Administered 2021-10-15: 155 [IU] via INTRAMUSCULAR

## 2021-10-15 NOTE — Progress Notes (Signed)
[facility]   Procedure Note Botox  Attending: Dr. Hikari Tripp  Preoperative Diagnosis(es): Chronic migraine  Consent obtained from: The patient Benefits discussed included, but were not limited to decreased muscle tightness, increased joint range of motion, and decreased pain.  Risk discussed included, but were not limited pain and discomfort, bleeding, bruising, excessive weakness, venous thrombosis, muscle atrophy and dysphagia.  Anticipated outcomes of the procedure as well as he risks and benefits of the alternatives to the procedure, and the roles and tasks of the personnel to be involved, were discussed with the patient, and the patient consents to the procedure and agrees to proceed. A copy of the patient medication guide was given to the patient which explains the blackbox warning.  Patients identity and treatment sites confirmed Yes.  .  Details of Procedure: Skin was cleaned with alcohol. Prior to injection, the needle plunger was aspirated to make sure the needle was not within a blood vessel.  There was no blood retrieved on aspiration.    Following is a summary of the muscles injected  And the amount of Botulinum toxin used:  Dilution 200 units of Botox was reconstituted with 4 ml of preservative free normal saline. Time of reconstitution: At the time of the office visit (<30 minutes prior to injection)   Injections  155 total units of Botox was injected with a 30 gauge needle.  Injection Sites: L occipitalis: 15 units- 3 sites  R occiptalis: 15 units- 3 sites  L upper trapezius: 15 units- 3 sites R upper trapezius: 15 units- 3 sits          L paraspinal: 10 units- 2 sites R paraspinal: 10 units- 2 sites  Face L frontalis(2 injection sites):10 units   R frontalis(2 injection sites):10 units         L corrugator: 5 units   R corrugator: 5 units           Procerus: 5 units   L temporalis: 20 units R temporalis: 20 units   Agent:  200 units of botulinum Type  A (Onobotulinum Toxin type A) was reconstituted with 4 ml of preservative free normal saline.  Time of reconstitution: At the time of the office visit (<30 minutes prior to injection)     Total injected (Units): 155  Total wasted (Units): none wasted  Patient tolerated procedure well without complications.   Reinjection is anticipated in 3 months.    

## 2021-12-01 ENCOUNTER — Telehealth: Payer: Self-pay

## 2021-12-01 NOTE — Telephone Encounter (Signed)
F/u   Laurie Oneal Key: [facility] [place] Case ID: [id number] Need help? Call us at [phone] Outcome Approvedtoday Request Reference Number: [id number]. BOTOX INJ 200UNIT is approved through 03/01/2022. Your patient may now fill this prescription and it will be covered. Drug Botox 200UNIT solution Form [facility] Electronic Prior Authorization Form (2017 NCPDP)

## 2021-12-01 NOTE — Telephone Encounter (Signed)
New message   Your information has been sent to [facility].  Laurie Oneal Key: [facility] [place] Case ID: [id number] Need help? Call us at [phone] Status Sent to [employer] Drug Botox 200UNIT solution Form [facility] Electronic Prior Authorization Form (2017 NCPDP)

## 2021-12-15 ENCOUNTER — Other Ambulatory Visit: Payer: Self-pay

## 2021-12-15 ENCOUNTER — Encounter: Payer: Self-pay | Admitting: Physical Medicine & Rehabilitation

## 2021-12-15 ENCOUNTER — Encounter: Payer: 59 | Attending: Physical Medicine & Rehabilitation | Admitting: Physical Medicine & Rehabilitation

## 2021-12-15 VITALS — BP 127/85 | HR 101 | Ht 60.0 in | Wt 127.0 lb

## 2021-12-15 DIAGNOSIS — A6920 Lyme disease, unspecified: Secondary | ICD-10-CM | POA: Diagnosis present

## 2021-12-15 DIAGNOSIS — M797 Fibromyalgia: Secondary | ICD-10-CM | POA: Insufficient documentation

## 2021-12-15 DIAGNOSIS — G894 Chronic pain syndrome: Secondary | ICD-10-CM | POA: Insufficient documentation

## 2021-12-15 DIAGNOSIS — M542 Cervicalgia: Secondary | ICD-10-CM | POA: Diagnosis not present

## 2021-12-15 MED ORDER — PREGABALIN 50 MG PO CAPS: 50.0000 mg | ORAL_CAPSULE | Freq: Three times a day (TID) | ORAL | 2 refills | Status: DC

## 2021-12-15 NOTE — Progress Notes (Signed)
Subjective:    Patient ID: Laurie Oneal, female    DOB: 1971/08/15, 51 y.o.   MRN: [id number]  HPI   This is initial evaluation for Mrs. Laurie Oneal who is a pleasant 51 yo here today for assessment of her chronic pain syndrome.  She was referred by Dr. Shon Millet.  He treats her chronically for migraine headaches. He has been seing her for a year. She was seeing Dr..Hagan prior.  She receives Botox as well as oral treatments for her headaches.  She also has a history of chronic cervicalgia.  There is an MRI of her cervical spine on file from March 2022 which demonstrates a small disc protrusion at C2-C3 without foraminal or cord impingement.  There is also a tiny central disc protrusion at C3-C4 without stenosis. She was treated by a pain clinic for the cervical pain including injections which did not help (actually the pain increased).   Her main complaint today is her fibromyalgia. She was diagnosed with FMS out of exclusion after extensive work up. She had a NCS/EMG by Dr. Alvester Morin which wsa negative as well. She has had dry needling which helped temporarily. She has done yoga and tries to stay active. However over the last two years her pain has significantly worsened and she hurts all over. Her physical capacity has decreased along with it. Pain seems to be worst in her lower extremities from thighs and knees on down. More recently she's had upper extremity syndrome. She finds it exhausting.   She drinks wine for relaxation and pain relief. She's drinking about 2 bottles per week more recently d/t her increase in pain.   For pain she's taking gabapentin at night and during day (only on weekends). She tried lyrica without any benefit but only took 75mg  at bedtime. She's been on cymbalta for a year, currently at 90mg . She's not sure that's helped. She's been on numerous anticonvulsants and tryptans for her migraines. She's also been on nurtec and ajovy, ubrelvy, and reyvow most recently  (which has helped). She recalls being on amitriptyline and imipramine in the past.   She's had massage, has a massage gun. She receives massages currently about 2x per month. She does yoga up to one time per week.   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 She reports being exposed to ticks and tick bites numerous times over the years.      Pain Inventory Average Pain 7 Pain Right Now 3 My pain is constant, sharp, burning, and dull  In the last 24 hours, has pain interfered with the following? General activity 3 Relation with others 3 Enjoyment of life 5 What TIME of day is your pain at its worst? evening Sleep (in general) Poor  Pain is worse with: unsure Pain improves with: heat/ice, medication, and massage Relief from Meds: 7  walk without assistance ability to climb steps?  yes do you drive?  yes  employed # of hrs/week 40 plus hours per week as [employer]  weakness numbness spasms depression anxiety loss of taste or smell  Any changes since last visit?  no  Any changes since last visit?  no    Family History  Problem Relation Age of Onset   Seizures Mother    Migraines Mother    Hypertension Father    Migraines Brother    Seizures Child    Migraines Child  Social History   Socioeconomic History   Marital status: Divorced    Spouse name: Not on file   Number of children: Not on file   Years of education: Not on file   Highest education level: Not on file  Occupational History   Not on file  Tobacco Use   Smoking status: Never   Smokeless tobacco: Never  Vaping Use   Vaping Use: Never used  Substance and Sexual Activity   Alcohol use: Yes    Alcohol/week: 0.0 standard drinks    Comment: occasionally   Drug use: Yes    Types: Other-see comments    Comment: CBD Gummies   Sexual activity: Not on file  Other Topics Concern   Not on file  Social History  Narrative   Right handed   Social Determinants of Health   Financial Resource Strain: Not on file  Food Insecurity: Not on file  Transportation Needs: Not on file  Physical Activity: Not on file  Stress: Not on file  Social Connections: Not on file   Past Surgical History:  Procedure Laterality Date   CESAREAN SECTION     x3   ERCP N/A 10/16/2018   Procedure: ENDOSCOPIC RETROGRADE CHOLANGIOPANCREATOGRAPHY (ERCP);  Surgeon: Vida RiggerMagod, Marc, MD;  [facility];  Service: Endoscopy;  Laterality: N/A;   REMOVAL OF STONES  10/16/2018   Procedure: REMOVAL OF STONES;  Surgeon: Vida RiggerMagod, Marc, MD;  Location: [facility];  Service: Endoscopy;;   SPHINCTEROTOMY  10/16/2018   Procedure: Dennison MascotSPHINCTEROTOMY;  Surgeon: Vida RiggerMagod, Marc, MD;  Location: [facility];  Service: Endoscopy;;   TUBAL LIGATION     Past Medical History:  Diagnosis Date   Fibromyalgia 2018   Headache(784.0)    Hypothyroidism    Seizures (HCC)    had sz as child and when they went away she started to have migrains   BP 127/85    Pulse (!) 101    Ht 5' (1.524 m)    Wt 127 lb (57.6 kg)    SpO2 98%    BMI 24.80 kg/m   Opioid Risk Score:   Fall Risk Score:  `1  Depression screen PHQ 2/9  Depression screen PHQ 2/9 12/15/2021  Decreased Interest 2  Down, Depressed, Hopeless 1  PHQ - 2 Score 3  Altered sleeping 3  Tired, decreased energy 2  Change in appetite 0  Feeling bad or failure about yourself  1  Trouble concentrating 1  Moving slowly or fidgety/restless 0  Suicidal thoughts 0  PHQ-9 Score 10    Review of Systems  Neurological:  Positive for weakness and numbness.  Psychiatric/Behavioral:         Depression & anxiety   All other systems reviewed and are negative.     Objective:   Physical Exam   General: Alert and oriented x 3, No apparent distress HEENT: Head is normocephalic, atraumatic, PERRLA, EOMI, sclera anicteric, oral mucosa pink and moist, dentition intact, ext ear canals clear,  Neck:  Supple without JVD or lymphadenopathy Heart: Reg rate and rhythm. No murmurs rubs or gallops Chest: CTA bilaterally without wheezes, rales, or rhonchi; no distress Abdomen: Soft, non-tender, non-distended, bowel sounds positive. Extremities: No clubbing, cyanosis, or edema. Pulses are 2+ Psych: Pt's affect is appropriate. Pt is cooperative Skin: Clean and intact without signs of breakdown Neuro: Alert and oriented x 3. Normal insight and awareness. Intact Memory. Normal language and speech. Cranial nerve exam unremarkable.  Motor function appears to be intact in all 4 limbs  without any signs of sensory function.  No limb ataxia is appreciated. Musculoskeletal: Patient demonstrates reasonable posture.  She does have some limitations in her left shoulder especially with extension as well as and external and internal rotation.  Right shoulder was unlimited.  She has multiple areas of discomfort pain including her bilateral ankle/Achilles areas, bilateral femoral condyle areas on both legs, pain along the glutes as well as PSIS areas bilaterally.  She also has pain at the bilateral greater trochanter regions.  There is mild tenderness along her thoracic and lumbar back but no focal tender points or trigger points are appreciated.  She has palpable trigger points in the bilateral traps as well as in her upper neck.  She is tenderness to palpation in both acromial areas as well as the elbows bilaterally.  I do not see any signs of obvious joint deformity in the upper or lower extremities.  There are no joint effusions appreciated either.  Patient seem to ambulate without any focal deficits or antalgia.      Assessment & Plan:  Chronic pain syndrome consistent with fibromyalgia.  She complains of diffuse body pain and demonstrates multiple tender areas on examination.  Additionally she has suffered with migraine headaches and sleep dysfunction as well as mood related issues, fibrofog, etc. Patient with  history of multiple tick exposures over the years Chronic migraine headaches managed by neurology   Plan: Made referral to integrative therapies for holistic physical therapy, addressing posture body mechanics as well as myofascial pain management, biofeedback, multiple modalities and alternative treatments as appropriate. Will discontinue gabapentin and restart Lyrica.  She tells me that she only took the 75 mg at bedtime.  We will introduce it back at 50 mg at bedtime for a week and then increase to 50 mg twice daily thereafter. Continue on Cymbalta 90 mg as she is doing Migraine management per Dr. Everlena Cooper  Patient has had good results with Botox injections for her migraines Ordered Lyme disease Western blot to assess for chronic Lyme disease.  This certainly can mimic fibromyalgia and she has been exposed multiple times to ticks during her career I do not see any obvious joint abnormalities on examination.  She has good range of motion except for the left shoulder which she had adhesive capsulitis and years ago.  Depending on how she progresses could consider repeating some of her lab work-up but will hold off for now.  Forty-five minutes of face to face patient care time were spent during this visit. All questions were encouraged and answered. Follow up with me in 2 mos.

## 2021-12-15 NOTE — Patient Instructions (Addendum)
PLEASE FEEL FREE TO CALL OUR OFFICE WITH ANY PROBLEMS OR QUESTIONS [phone])   Stop gabapentin  Begin lyrica 50mg  at night for one week then increase to twice daily.

## 2021-12-17 LAB — LYME DISEASE, WESTERN BLOT
IgG P18 Ab.: ABSENT
IgG P23 Ab.: ABSENT
IgG P28 Ab.: ABSENT
IgG P30 Ab.: ABSENT
IgG P39 Ab.: ABSENT
IgG P41 Ab.: ABSENT
IgG P45 Ab.: ABSENT
IgG P58 Ab.: ABSENT
IgG P66 Ab.: ABSENT
IgG P93 Ab.: ABSENT
IgM P23 Ab.: ABSENT
IgM P39 Ab.: ABSENT
IgM P41 Ab.: ABSENT
Lyme IgG Wb: NEGATIVE
Lyme IgM Wb: NEGATIVE

## 2021-12-31 ENCOUNTER — Telehealth: Payer: Self-pay | Admitting: *Deleted

## 2021-12-31 NOTE — Telephone Encounter (Signed)
Expected delivery date is 01/04/2022.  [facility] said it is billed through medical benefit and her copay is over $1000. They asked if I wanted to wait while they called the patient to see if she has the co-pay card I said yes I will hold.  They came back to the phone stating she reactivated the co-pay card and it is her responsibility to contact them to use the co-pay card or she will be responsible for the entire co-pay. They also said once the Botox leaves their pharmacy we may not return it and a signature will be required. I acknowledged that and verified our address.

## 2022-01-14 ENCOUNTER — Ambulatory Visit: Payer: 59 | Admitting: Neurology

## 2022-01-14 ENCOUNTER — Encounter: Payer: Self-pay | Admitting: Physical Medicine & Rehabilitation

## 2022-01-14 ENCOUNTER — Other Ambulatory Visit: Payer: Self-pay

## 2022-01-14 ENCOUNTER — Ambulatory Visit (INDEPENDENT_AMBULATORY_CARE_PROVIDER_SITE_OTHER): Payer: 59 | Admitting: Neurology

## 2022-01-14 ENCOUNTER — Telehealth: Payer: Self-pay

## 2022-01-14 ENCOUNTER — Telehealth: Payer: Self-pay | Admitting: Neurology

## 2022-01-14 DIAGNOSIS — M797 Fibromyalgia: Secondary | ICD-10-CM

## 2022-01-14 DIAGNOSIS — G43719 Chronic migraine without aura, intractable, without status migrainosus: Secondary | ICD-10-CM | POA: Diagnosis not present

## 2022-01-14 DIAGNOSIS — G43109 Migraine with aura, not intractable, without status migrainosus: Secondary | ICD-10-CM

## 2022-01-14 MED ORDER — REYVOW 100 MG PO TABS: 100.0000 mg | ORAL_TABLET | Freq: Every day | ORAL | 5 refills | Status: DC | PRN

## 2022-01-14 MED ORDER — ONABOTULINUMTOXINA 100 UNITS IJ SOLR
200.0000 [IU] | Freq: Once | INTRAMUSCULAR | Status: AC
  Administered 2022-01-14: 155 [IU] via INTRAMUSCULAR

## 2022-01-14 MED ORDER — REYVOW 100 MG PO TABS: ORAL_TABLET | ORAL | 0 refills | Status: DC

## 2022-01-14 MED ORDER — PREGABALIN 100 MG PO CAPS: 100.0000 mg | ORAL_CAPSULE | Freq: Two times a day (BID) | ORAL | 3 refills | Status: DC

## 2022-01-14 NOTE — Telephone Encounter (Signed)
Lyrica increased to 100mg  bid. Will continue along with gabapentin for now. Pt called and titration reviewed

## 2022-01-14 NOTE — Telephone Encounter (Signed)
Reyvow 100mg  effective.  Sent prescription to pharmacy

## 2022-01-14 NOTE — Progress Notes (Signed)
Botulinum Clinic  ° °Procedure Note Botox ° °Attending: Dr. Traycen Goyer ° °Preoperative Diagnosis(es): Chronic migraine ° °Consent obtained from: The patient °Benefits discussed included, but were not limited to decreased muscle tightness, increased joint range of motion, and decreased pain.  Risk discussed included, but were not limited pain and discomfort, bleeding, bruising, excessive weakness, venous thrombosis, muscle atrophy and dysphagia.  Anticipated outcomes of the procedure as well as he risks and benefits of the alternatives to the procedure, and the roles and tasks of the personnel to be involved, were discussed with the patient, and the patient consents to the procedure and agrees to proceed. A copy of the patient medication guide was given to the patient which explains the blackbox warning. ° °Patients identity and treatment sites confirmed Yes.  . ° °Details of Procedure: °Skin was cleaned with alcohol. Prior to injection, the needle plunger was aspirated to make sure the needle was not within a blood vessel.  There was no blood retrieved on aspiration.   ° °Following is a summary of the muscles injected  And the amount of Botulinum toxin used: ° °Dilution °200 units of Botox was reconstituted with 4 ml of preservative free normal saline. °Time of reconstitution: At the time of the office visit (<30 minutes prior to injection)  ° °Injections  °155 total units of Botox was injected with a 30 gauge needle. ° °Injection Sites: °L occipitalis: 15 units- 3 sites  °R occiptalis: 15 units- 3 sites ° °L upper trapezius: 15 units- 3 sites °R upper trapezius: 15 units- 3 sits          °L paraspinal: 10 units- 2 sites °R paraspinal: 10 units- 2 sites ° °Face °L frontalis(2 injection sites):10 units   °R frontalis(2 injection sites):10 units         °L corrugator: 5 units   °R corrugator: 5 units           °Procerus: 5 units   °L temporalis: 20 units °R temporalis: 20 units  ° °Agent:  °200 units of botulinum Type  A (Onobotulinum Toxin type A) was reconstituted with 4 ml of preservative free normal saline.  °Time of reconstitution: At the time of the office visit (<30 minutes prior to injection)  ° ° ° Total injected (Units):  155 ° Total wasted (Units):  45 ° °Patient tolerated procedure well without complications.   °Reinjection is anticipated in 3 months. ° ° °

## 2022-01-14 NOTE — Telephone Encounter (Signed)
New message   Your information has been sent to [facility].  Laurie Oneal (Key: [name]) Reyvow 100MG  tablets   Form [facility] Electronic Prior Authorization Form (2017 NCPDP) Created 5 minutes ago Sent to Plan 4 minutes ago Plan Response 4 minutes ago Submit Clinical Questions less than a minute ago Determination Wait for Determination Please wait for [facility] 2017 NCPDP to return a determination.

## 2022-01-17 NOTE — Telephone Encounter (Signed)
F/u  [name] (Key: [name]) Reyvow 100MG  tablets   Form [facility] Electronic Prior Authorization Form (2017 NCPDP) Created 3 days ago Sent to Plan 3 days ago Plan Response 3 days ago Submit Clinical Questions 3 days ago Determination Favorable 3 days ago Message from Plan Request Reference Number: [id number]. REYVOW TAB 100MG  is approved through 01/14/2023. Your patient may now fill this prescription and it will be covered.

## 2022-03-14 ENCOUNTER — Other Ambulatory Visit: Payer: Self-pay | Admitting: Neurology

## 2022-03-14 ENCOUNTER — Telehealth (HOSPITAL_COMMUNITY): Payer: Self-pay | Admitting: Pharmacy Technician

## 2022-03-14 ENCOUNTER — Other Ambulatory Visit (HOSPITAL_COMMUNITY): Payer: Self-pay

## 2022-03-14 NOTE — Telephone Encounter (Signed)
Patient Advocate Encounter ?  ?Received notification that prior authorization for Botox 200UNIT solution is required. ?  ?PA submitted on 03/14/2022 ?Key [id number] ?Status is pending ?   ? ? ? ?Roland Earl, CPhT ?Pharmacy Patient Advocate Specialist ?[facility] Patient Advocate Team ?Direct Number: [phone]  Fax: [phone]  ?

## 2022-03-14 NOTE — Telephone Encounter (Signed)
Patient Advocate Encounter ? ?Prior Authorization for Botox 200UNIT solution  has been approved.   ? ?PA# [id number] ?Effective dates: 03/14/2022 through 06/13/2022 ? ? ? ? ? ?Roland Earl, CPhT ?Pharmacy Patient Advocate Specialist ?[facility] Patient Advocate Team ?Direct Number: [phone]  Fax: [phone]  ?

## 2022-03-30 ENCOUNTER — Encounter: Payer: 59 | Attending: Physical Medicine & Rehabilitation | Admitting: Physical Medicine & Rehabilitation

## 2022-03-30 ENCOUNTER — Encounter: Payer: Self-pay | Admitting: Physical Medicine & Rehabilitation

## 2022-03-30 VITALS — BP 146/82 | HR 69 | Ht 60.0 in | Wt 125.0 lb

## 2022-03-30 DIAGNOSIS — M797 Fibromyalgia: Secondary | ICD-10-CM | POA: Diagnosis present

## 2022-03-30 DIAGNOSIS — M7918 Myalgia, other site: Secondary | ICD-10-CM | POA: Diagnosis present

## 2022-03-30 MED ORDER — BACLOFEN 10 MG PO TABS: 10.0000 mg | ORAL_TABLET | Freq: Two times a day (BID) | ORAL | 4 refills | Status: DC | PRN

## 2022-03-30 NOTE — Patient Instructions (Addendum)
STRETCH YOUR LEGS BEFORE BED ?PLENTY OF FLUIDS EACH DAY ?MAKE SURE YOU GETTING ENOUGH MAGNESIUM, CALCIUM, POTASSIUM IN YOUR DIET EACH DAY.  ? ?SCHEDULE BACLOFEN AT NIGHT ?

## 2022-03-30 NOTE — Progress Notes (Signed)
? ?Subjective:  ? ? Patient ID: Laurie Oneal, female    DOB: February 08, 1971, 51 y.o.   MRN: [id number] ? ?HPI ? ?Laurie Oneal is here in follow up of her FMS. She stopped the lyrica and cymbalta due to weight gain and swelling as wel as fatigue/fog.  She stopped both medications as she was not sure if there was really helping her anyway.  She reports that she actually feels better off both medications. ? ?[facility] is working on taping and upper or distal arm/wrist and forearm.  They have been focusing on posterior and muscle strength/balance.  She feels that the taping has helped.  She feels that the work they have done has been beneficial for her.  She tells me that the therapist felt that she did not have fibromyalgia. ? ?She also reports low back pain when she works for a while.  She has to stretch and take breaks intermittently. ? ?A new complaint she has noticed is some spasms in her anterior leg sometimes down into the toes.  They are happening often at night.  She does use baclofen as needed with some benefit.  Usually take place after she has sat down or is lying down.  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  Most commonly her foot is dorsiflexed and holding over the pedal before she actually presses the pedal down. ? ?After last visit we did some testing for Lyme disease which was negative.  She had  other extensive testing prior as well. ? ? ? ?Pain Inventory ?Average Pain 5 ?Pain Right Now 2 ?My pain is intermittent, burning, and aching ? ?In the last 24 hours, has pain interfered with the following? ?General activity 3 ?Relation with others 0 ?Enjoyment of life 3 ?What TIME of day is your pain at its worst? evening ?Sleep (in general) Poor ? ?Pain is worse with: bending and some activites ?Pain improves with: therapy/exercise, medication, injections, and massage ?Relief from Meds:  5 ? ?Family History  ?Problem Relation Age of Onset  ? Seizures Mother   ? Migraines Mother   ? Hypertension Father   ? Migraines Brother   ? Seizures Child   ? Migraines Child   ? ?Social History  ? ?Socioeconomic History  ? Marital status: Divorced  ?  Spouse name: Not on file  ? Number of children: Not on file  ? Years of education: Not on file  ? Highest education level: Not on file  ?Occupational History  ? Not on file  ?Tobacco Use  ? Smoking status: Never  ? Smokeless tobacco: Never  ?Vaping Use  ? Vaping Use: Never used  ?Substance and Sexual Activity  ? Alcohol use: Yes  ?  Alcohol/week: 0.0 standard drinks  ?  Comment: occasionally  ? Drug use: Yes  ?  Types: Other-see comments  ?  Comment: CBD Gummies  ? Sexual activity: Not on file  ?Other Topics Concern  ? Not on file  ?Social History Narrative  ? Right handed  ? ?Social Determinants of Health  ? ?Financial Resource Strain: Not on file  ?Food Insecurity: Not on file  ?Transportation Needs: Not on file  ?Physical Activity: Not on file  ?Stress: Not on file  ?Social Connections: Not on file  ? ?Past Surgical History:  ?Procedure Laterality Date  ? CESAREAN SECTION    ? x3  ?  ERCP N/A 10/16/2018  ? Procedure: ENDOSCOPIC RETROGRADE CHOLANGIOPANCREATOGRAPHY (ERCP);  Surgeon: Clarene Essex, MD;  Location: [facility];  Service: Endoscopy;  Laterality: N/A;  ? REMOVAL OF STONES  10/16/2018  ? Procedure: REMOVAL OF STONES;  Surgeon: Clarene Essex, MD;  Location: [facility];  Service: Endoscopy;;  ? SPHINCTEROTOMY  10/16/2018  ? Procedure: SPHINCTEROTOMY;  Surgeon: Clarene Essex, MD;  Location: [facility];  Service: Endoscopy;;  ? TUBAL LIGATION    ? ?Past Surgical History:  ?Procedure Laterality Date  ? CESAREAN SECTION    ? x3  ? ERCP N/A 10/16/2018  ? Procedure: ENDOSCOPIC RETROGRADE CHOLANGIOPANCREATOGRAPHY (ERCP);  Surgeon: Clarene Essex, MD;  Location: [facility];  Service: Endoscopy;  Laterality: N/A;  ? REMOVAL OF STONES  10/16/2018  ? Procedure: REMOVAL  OF STONES;  Surgeon: Clarene Essex, MD;  Location: [facility];  Service: Endoscopy;;  ? SPHINCTEROTOMY  10/16/2018  ? Procedure: SPHINCTEROTOMY;  Surgeon: Clarene Essex, MD;  Location: [facility];  Service: Endoscopy;;  ? TUBAL LIGATION    ? ?Past Medical History:  ?Diagnosis Date  ? Fibromyalgia 2018  ? Headache(784.0)   ? Hypothyroidism   ? Seizures ([place])   ? had sz as child and when they went away she started to have migrains  ? ?BP (!) 146/82   Pulse 69   Ht 5' (1.524 m)   Wt 125 lb (56.7 kg)   SpO2 97%   BMI 24.41 kg/m?  ? ?Opioid Risk Score:   ?Fall Risk Score:  `1 ? ?Depression screen PHQ 2/9 ? ? ?  03/30/2022  ?  3:28 PM 12/15/2021  ?  1:46 PM  ?Depression screen PHQ 2/9  ?Decreased Interest 2 2  ?Down, Depressed, Hopeless 2 1  ?PHQ - 2 Score 4 3  ?Altered sleeping  3  ?Tired, decreased energy  2  ?Change in appetite  0  ?Feeling bad or failure about yourself   1  ?Trouble concentrating  1  ?Moving slowly or fidgety/restless  0  ?Suicidal thoughts  0  ?PHQ-9 Score  10  ?  ? ?Review of Systems  ?Constitutional: Negative.   ?HENT: Negative.    ?Eyes: Negative.   ?Respiratory: Negative.    ?Cardiovascular: Negative.   ?Gastrointestinal: Negative.   ?Endocrine: Negative.   ?Genitourinary: Negative.   ?Musculoskeletal:  Positive for back pain.  ?Skin: Negative.   ?Allergic/Immunologic: Negative.   ?Neurological: Negative.   ?Hematological: Negative.   ?Psychiatric/Behavioral:  Positive for sleep disturbance.   ? ?   ?Objective:  ? Physical Exam ? ?General: No acute distress ?HEENT: NCAT, EOMI, oral membranes moist ?Cards: reg rate  ?Chest: normal effort ?Abdomen: Soft, NT, ND ?Skin: dry, intact ?Extremities: no edema ?Psych: pleasant and appropriate  ?Skin: Clean and intact without signs of breakdown ?Neuro: Alert and oriented x 3. Normal insight and awareness. Intact Memory. Normal language and speech. Cranial nerve exam unremarkable.  Motor function appears to be intact in all 4 limbs without any signs of  sensory function.  No limb ataxia is appreciated. ?Musculoskeletal: Patient seem to be moving better today.  Posture is fair.  Gait was normal for tandem movement.  Posture seem more appropriate today as well. ?  ?  ?  ?  ?Assessment & Plan:  ?Chronic pain syndrome consistent with fibromyalgia.  Certainly there is also an occupational component to this causing significant myofascial dysfunction, spasms, aggravating her low back.    ?Chronic migraine headaches managed by neurology ?  ?  ?Plan: ?Continue with treatment  at integrative therapies focusing on myofascial techniques and really focusing on ergonomics and occupational issues that are likely causing a lot of her pain symptoms including the forearms low back and even her right leg cramping. 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  Advised her to take her baclofen nightly at this point.  Also encouraged adequate fluids and ample electrolytes including calcium, magnesium, potassium, sodium.  Also she would benefit from stretching of her foot and arm before she goes to bed. ?Migraine management per Dr. Tomi Likens  Patient has had good results with Botox injections for her migraines ?We discussed the fact that there is probably not "cure for all these aches and pains" but that she certainly can manage these with awareness of the cause as well as economical adjustments and a home exercise program. ?  ?15 minutes of face to face patient care time were spent during this visit. All questions were encouraged and answered. Follow up with me in 4 months.  ? ? ? ?   ?Assessment & Plan:  ? ? ?

## 2022-04-15 ENCOUNTER — Ambulatory Visit: Payer: 59 | Admitting: Neurology

## 2022-04-15 DIAGNOSIS — G43109 Migraine with aura, not intractable, without status migrainosus: Secondary | ICD-10-CM | POA: Diagnosis not present

## 2022-04-15 MED ORDER — ONABOTULINUMTOXINA 100 UNITS IJ SOLR
200.0000 [IU] | Freq: Once | INTRAMUSCULAR | Status: AC
  Administered 2022-04-15: 155 [IU] via INTRAMUSCULAR

## 2022-04-15 NOTE — Progress Notes (Signed)
Botulinum Clinic  ° °Procedure Note Botox ° °Attending: Dr. Jill Ruppe ° °Preoperative Diagnosis(es): Chronic migraine ° °Consent obtained from: The patient °Benefits discussed included, but were not limited to decreased muscle tightness, increased joint range of motion, and decreased pain.  Risk discussed included, but were not limited pain and discomfort, bleeding, bruising, excessive weakness, venous thrombosis, muscle atrophy and dysphagia.  Anticipated outcomes of the procedure as well as he risks and benefits of the alternatives to the procedure, and the roles and tasks of the personnel to be involved, were discussed with the patient, and the patient consents to the procedure and agrees to proceed. A copy of the patient medication guide was given to the patient which explains the blackbox warning. ° °Patients identity and treatment sites confirmed Yes.  . ° °Details of Procedure: °Skin was cleaned with alcohol. Prior to injection, the needle plunger was aspirated to make sure the needle was not within a blood vessel.  There was no blood retrieved on aspiration.   ° °Following is a summary of the muscles injected  And the amount of Botulinum toxin used: ° °Dilution °200 units of Botox was reconstituted with 4 ml of preservative free normal saline. °Time of reconstitution: At the time of the office visit (<30 minutes prior to injection)  ° °Injections  °155 total units of Botox was injected with a 30 gauge needle. ° °Injection Sites: °L occipitalis: 15 units- 3 sites  °R occiptalis: 15 units- 3 sites ° °L upper trapezius: 15 units- 3 sites °R upper trapezius: 15 units- 3 sits          °L paraspinal: 10 units- 2 sites °R paraspinal: 10 units- 2 sites ° °Face °L frontalis(2 injection sites):10 units   °R frontalis(2 injection sites):10 units         °L corrugator: 5 units   °R corrugator: 5 units           °Procerus: 5 units   °L temporalis: 20 units °R temporalis: 20 units  ° °Agent:  °200 units of botulinum Type  A (Onobotulinum Toxin type A) was reconstituted with 4 ml of preservative free normal saline.  °Time of reconstitution: At the time of the office visit (<30 minutes prior to injection)  ° ° ° Total injected (Units):  155 ° Total wasted (Units):  45 ° °Patient tolerated procedure well without complications.   °Reinjection is anticipated in 3 months. ° ° °

## 2022-04-27 ENCOUNTER — Telehealth: Payer: Self-pay | Admitting: Neurology

## 2022-04-27 NOTE — Telephone Encounter (Signed)
Patient called and LM with AN. AN told her to call office and to be seen within 2 days. Patient had botox about 10 days ago. She woke up with a stiff neck, pain in head/face, her lymph notes are swollen. She wants to know if this could be a reaction to the botox.

## 2022-04-27 NOTE — Telephone Encounter (Signed)
Patient advised of Dr.Jaffe,  Neck pain is a common side effect of Botox.  She is treated for myofascial pain by pain management and may want to ask about increasing baclofen in meantime (symptoms/effects of Botox are not permanent).  However, if she has swollen lymph nodes, there may be something else going on such as underlying infection.  She has been on botox before without similar symptoms, so something else should be ruled out.  Would recommend following up with PCP to make sure there isn't anything else going on.    Patient will call her appt.

## 2022-05-16 ENCOUNTER — Telehealth (HOSPITAL_COMMUNITY): Payer: Self-pay | Admitting: Pharmacy Technician

## 2022-05-16 NOTE — Telephone Encounter (Signed)
Patient Advocate Encounter   Received notification that prior authorization for Botox 200UNIT solution is required.   PA submitted on 05/16/2022 Key [id number] Status is pending       Roland Earl, CPhT Pharmacy Patient Advocate Specialist [facility] Patient Advocate Team Direct Number: [phone]  Fax: [phone]

## 2022-05-16 NOTE — Telephone Encounter (Signed)
Patient Advocate Encounter  Prior Authorization for Botox 200UNIT solution has been approved.    PA# [id number] Effective dates: 05/16/2022 through 08/16/2022      Roland Earl, CPhT Pharmacy Patient Advocate Specialist [facility] Patient Advocate Team Direct Number: [phone]  Fax: [phone]

## 2022-07-15 ENCOUNTER — Ambulatory Visit: Payer: 59 | Admitting: Neurology

## 2022-07-15 DIAGNOSIS — G43109 Migraine with aura, not intractable, without status migrainosus: Secondary | ICD-10-CM

## 2022-07-15 MED ORDER — ONABOTULINUMTOXINA 200 UNITS IJ SOLR
200.0000 [IU] | Freq: Once | INTRAMUSCULAR | Status: AC
Start: 2022-07-15 — End: 2022-07-15
  Administered 2022-07-15: 155 [IU] via INTRAMUSCULAR

## 2022-07-15 NOTE — Progress Notes (Signed)
Botulinum Clinic  ° °Procedure Note Botox ° °Attending: Dr. Garmon Dehn ° °Preoperative Diagnosis(es): Chronic migraine ° °Consent obtained from: The patient °Benefits discussed included, but were not limited to decreased muscle tightness, increased joint range of motion, and decreased pain.  Risk discussed included, but were not limited pain and discomfort, bleeding, bruising, excessive weakness, venous thrombosis, muscle atrophy and dysphagia.  Anticipated outcomes of the procedure as well as he risks and benefits of the alternatives to the procedure, and the roles and tasks of the personnel to be involved, were discussed with the patient, and the patient consents to the procedure and agrees to proceed. A copy of the patient medication guide was given to the patient which explains the blackbox warning. ° °Patients identity and treatment sites confirmed Yes.  . ° °Details of Procedure: °Skin was cleaned with alcohol. Prior to injection, the needle plunger was aspirated to make sure the needle was not within a blood vessel.  There was no blood retrieved on aspiration.   ° °Following is a summary of the muscles injected  And the amount of Botulinum toxin used: ° °Dilution °200 units of Botox was reconstituted with 4 ml of preservative free normal saline. °Time of reconstitution: At the time of the office visit (<30 minutes prior to injection)  ° °Injections  °155 total units of Botox was injected with a 30 gauge needle. ° °Injection Sites: °L occipitalis: 15 units- 3 sites  °R occiptalis: 15 units- 3 sites ° °L upper trapezius: 15 units- 3 sites °R upper trapezius: 15 units- 3 sits          °L paraspinal: 10 units- 2 sites °R paraspinal: 10 units- 2 sites ° °Face °L frontalis(2 injection sites):10 units   °R frontalis(2 injection sites):10 units         °L corrugator: 5 units   °R corrugator: 5 units           °Procerus: 5 units   °L temporalis: 20 units °R temporalis: 20 units  ° °Agent:  °200 units of botulinum Type  A (Onobotulinum Toxin type A) was reconstituted with 4 ml of preservative free normal saline.  °Time of reconstitution: At the time of the office visit (<30 minutes prior to injection)  ° ° ° Total injected (Units):  155 ° Total wasted (Units):  45 ° °Patient tolerated procedure well without complications.   °Reinjection is anticipated in 3 months. ° ° °

## 2022-07-25 ENCOUNTER — Telehealth (HOSPITAL_COMMUNITY): Payer: Self-pay | Admitting: Pharmacy Technician

## 2022-07-25 NOTE — Telephone Encounter (Signed)
Patient Advocate Encounter   Received notification that prior authorization for Botox 200UNIT solution is required.   PA submitted on 07/25/2022 Key [id number] Status is pending       Roland Earl, CPhT Pharmacy Patient Advocate Specialist [facility] Patient Advocate Team Direct Number: [phone]  Fax: [phone]

## 2022-07-28 ENCOUNTER — Telehealth: Payer: Self-pay | Admitting: Neurology

## 2022-07-28 NOTE — Telephone Encounter (Signed)
Patient Advocate Encounter  Prior Authorization for  Botox 200UNIT solution has been approved.    PA# [id number] Effective dates: 07/28/2022 through 10/27/2022      Roland Earl, CPhT Pharmacy Patient Advocate Specialist [facility] Patient Advocate Team Direct Number: [phone]  Fax: [phone]

## 2022-07-28 NOTE — Telephone Encounter (Signed)
Laurie Oneal called in and left a message with the access nurse. He is calling about prior authorization. Ref # [id number].

## 2022-08-02 ENCOUNTER — Telehealth (HOSPITAL_COMMUNITY): Payer: Self-pay | Admitting: Pharmacy Technician

## 2022-08-02 ENCOUNTER — Other Ambulatory Visit: Payer: Self-pay | Admitting: Physical Medicine & Rehabilitation

## 2022-08-02 NOTE — Telephone Encounter (Signed)
Patient Advocate Encounter  Prior Authorization for Nurtec 75MG  dispersible tablets has been approved.    PA# Effective dates: 08/02/2022 through 08/03/2023      10/03/2023, CPhT Pharmacy Patient Advocate Specialist [facility] Patient Advocate Team Direct Number: [phone]  Fax: [phone]

## 2022-08-02 NOTE — Telephone Encounter (Signed)
Patient Advocate Encounter   Received notification that prior authorization for Nurtec 75MG  dispersible tablets is required.   PA submitted on 08/02/2022 Key [id number] Status is pending       10/02/2022, CPhT Pharmacy Patient Advocate Specialist [facility] Patient Advocate Team Direct Number: [phone]  Fax: [phone]

## 2022-08-03 ENCOUNTER — Encounter: Payer: 59 | Admitting: Physical Medicine & Rehabilitation

## 2022-09-07 NOTE — Progress Notes (Signed)
NEUROLOGY FOLLOW UP OFFICE NOTE  SCOTLYN GOTSHALL [id number]  Assessment/Plan:   Migraine with aura, without status migrainosus, not intractable Chronic pain syndrome   Migraine prevention:  Botox.  Has not used Nurtec, so will discontinue Migraine rescue:  Reyvow Limit use of pain relievers to no more than 2 days out of week to prevent risk of rebound or medication-overuse headache. Keep headache diary Follow up for next Botox.  Follow up for routine office visit in one year     Subjective:  Laurie Oneal is a 51 year old right-handed female with chronic pain syndrome and hypothyroidism and history of childhood seizures who follows up for chronic migraine.   UPDATE: Referred to Dr. Tessa Lerner for pain management.  1 severe migraine every 3 months, lasting 2 hours with Reyvow.  Rescue protocol:  Reyvow Current NSAIDS/analgesics:  none Current triptans:  Adverse reaction Current ergotamine:  Adverse reaction Current anti-emetic:  Zofran ODT 4mg  Current muscle relaxants:  baclofen Current Antihypertensive medications:  none Current Antidepressant medications:  none Current Anticonvulsant medications:  zonisamide (currently taking 100mg  - tapering off from 200mg  - concern if it may contributing to her paresthesias) ,none Current anti-CGRP:  Nurtec Current Vitamins/Herbal/Supplements:  none Current Antihistamines/Decongestants:  none Other therapy:  Botox,  Reyvow Hormone/birth control:  Synthroid Other medications:  Buspirone, Synthroid, Alprazolam, Lunesta   Caffeine:  1 cup of coffee daily.   Diet:  Does not drink enough water.  No soda.  Skips meals Exercise:  Yoga/pilates once a week.  Trying to go back to the gym. Depression:  yes; Anxiety:  yes Other pain:  Chronic generalized pain, cervical spondylosis with chronic neck pain.  Icy/burning pain in legs.   Sleep hygiene:  Poor.  On Ambien   HISTORY: Onset:  Middle school Location:  Varies -bilateral  frontal, temporal, occipital regions radiating to neck and shoulders bilaterally Quality:  Throbbing, sharp, stabbing, dull Initial Intensity:  severe.  She denies new headache, thunderclap headache or severe headache that wakes her from sleep. Aura:  3 prior migraines associated with vision loss in right eye with aphasia (one time with memory recall and flashes in her vision) - lasted about 6 minutes followed by severe headache. Prodrome:  Stomach ache, foggy-headed/word-finding trouble Postdrome:  Fatigue, soreness Associated symptoms:  Nausea, vomiting, blurred vision, photophobia, phonophobia, osmophobia, eye pain.  She denies associated unilateral numbness or weakness. Initial Duration:  3 days  Initial Frequency:  On Botox, she may have headache-free months but will start getting migraines 2 weeks prior to next injection. Triggers/aggravating factors:  Movement, bright light, change in weather, stress, menses Relieving factors:  Botox has worked the best Activity:  Headaches aggravated by movement   Patient has chronic neck pain.  She sees pain management  MRI of cervical spine from 02/14/2021 personally reviewed showed tiny central disc protrusions at C2-3 and C3-4 without significant stenosis or cord impingement.  Also noted were edema and enhancement involving the left posterior paraspinal muscles posterior to the left facets at C2 through C7 presumably related to recent radiofrequency ablation.  Otherwise unremarkable.  She continues to experience shooting pain down her arm with numbness in her 5th finger of left hand and when using her hands, may have numbness and cramping involving the entire hands bilaterally.  SShe has been evaluated by rheumatology and was told she may have fibromyalgia.  For evaluation of chronic pain, she had NCV-EMG on 04/27/2021 which was normal.    Remote CT of head from  04/02/2003 to evaluate headaches was negative.     Past NSAIDS/analgesics:  Midrin, Excedrin,  Tylenol, Ultram, Vicodin, Advil, naproxen, Mobic, Toradol, Cambia, zipsor, Dilaudid, Percocet, Oxycontin Past abortive triptans:  Sumatriptan (adverse reaction), Amerge, Axert, Zomig tab Past abortive ergotamine:  Migranal Past muscle relaxants:  Flexeril, Robaxin, Skelaxin Past anti-emetic:  Thorazine, Zofran, promethazine Past antihypertensive medications:  none Past antidepressant medications:  imipramine, duloxetine, sertraline Past anticonvulsant medications: topiramate, gabapentin, pregablin Past anti-CGRP:  Ajovy (worsened headaches), Roselyn Meier Past vitamins/Herbal/Supplements:  none Past antihistamines/decongestants:  Benadryl, Zyrtec, Claritin, Allegra, Nasonex, Flonase Other past therapies:  Trigger point injections, bilateral cervical-radiofrequency ablations.     Family history of headache:  Mother, brother, son  PAST MEDICAL HISTORY: Past Medical History:  Diagnosis Date   Fibromyalgia 2018   Headache(784.0)    Hypothyroidism    Seizures ([place])    had sz as child and when they went away she started to have migrains    MEDICATIONS: Current Outpatient Medications on File Prior to Visit  Medication Sig Dispense Refill   b complex vitamins tablet Take 1 tablet by mouth daily. Takes sporatically     baclofen (LIORESAL) 10 MG tablet TAKE 1 TABLET BY MOUTH 2 TIMES DAILY AS NEEDED. 45 tablet 4   BOTOX 200 units SOLR INJECT 155 UNITS  INTRAMUSCULARLY INTO HEAD,  NECK, AND FACE EVERY 12  WEEKS 1 each 4   botulinum toxin Type A (BOTOX) 100 units SOLR injection See admin instructions. (Patient not taking: Reported on 03/30/2022)     busPIRone (BUSPAR) 15 MG tablet Take 15 mg by mouth 2 (two) times daily as needed. Take 1/2 tab     chlorproMAZINE (THORAZINE) 25 MG tablet TAKE 1 TABLET BY MOUTH 3 TIMES DAILY AS NEEDED FOR HEADACHE (Patient not taking: Reported on 12/15/2021)     Cholecalciferol (VITAMIN D3) 125 MCG (5000 UT) CAPS Take 1 capsule by mouth daily.      Diclofenac  Potassium,Migraine, (CAMBIA) 50 MG PACK Take 50 tablets by mouth as needed.     DULoxetine (CYMBALTA) 60 MG capsule Take 60 mg by mouth daily. (Patient not taking: Reported on 03/30/2022)     Eszopiclone 3 MG TABS Take 3 mg by mouth daily.     ibuprofen (ADVIL,MOTRIN) 200 MG tablet Take 600 mg by mouth every 6 (six) hours as needed for headache.     Lasmiditan Succinate (REYVOW) 100 MG TABS Take 100 mg by mouth daily as needed (Maximum 1 tablet in 24 ours). 8 tablet 5   LORazepam (ATIVAN) 1 MG tablet Take 1 mg by mouth at bedtime as needed for sleep.     Melatonin 10 MG TABS 1 tablet at bedtimd     Multiple Vitamin (MULTIVITAMIN WITH MINERALS) TABS Take 1 tablet by mouth daily.     ondansetron (ZOFRAN) 8 MG tablet Take 4 mg by mouth as needed.     progesterone (PROMETRIUM) 100 MG capsule Take 100 mg by mouth daily. Take 1 tablet twice a week     Rimegepant Sulfate (NURTEC) 75 MG TBDP Take 75 mg by mouth daily as needed (Maximum 1 tablet in 24 hours). (Patient not taking: Reported on 03/30/2022) 8 tablet 5   SYNTHROID 50 MCG tablet Take 50 mcg by mouth daily.     Ubrogepant (UBRELVY) 100 MG TABS Ubrelvy 100 mg tablet  TAKE 1 TABLET BY MOUTH AT ONSET OF HEADACHE. MAY REPEAT IN 2 HOURS. *MAX OF 2 TABS IN 24 HRS*.     No current facility-administered medications on  file prior to visit.    ALLERGIES: Allergies  Allergen Reactions   Dihydroergotamine Nausea And Vomiting    Uncontrollable vomiting Other reaction(s): Dizziness, Headache, vomit, Vomiting   Gluten Meal Rash    Other reaction(s): Abdominal Pain, skin breaks out   Latex Rash    Other reaction(s): rash   Oxycodone-Acetaminophen Nausea And Vomiting and Rash    Other reaction(s): vomit migraine Vomiting,weakness    Percocet [Oxycodone-Acetaminophen] Nausea And Vomiting and Other (See Comments)    migraine   Sumatriptan Nausea Only and Photosensitivity    *feels like someone pouring acid on skull* Other reaction(s): Headache, Other,  severe headaches *feels like someone pouring acid on skull* *feels like someone pouring acid on skull*    Penicillins Nausea And Vomiting and Hives    **SEVERE VOMITING Has patient had a PCN reaction causing immediate rash, facial/tongue/throat swelling, SOB or lightheadedness with hypotension: no Has patient had a PCN reaction causing severe rash involving mucus membranes or skin necrosis: no Has patient had a PCN reaction that required hospitalization:  no Has patient had a PCN reaction occurring within the last 10 years: yes If all of the above answers are "NO", then may proceed with Cephalosporin use.  Other reaction(s): hives **SEVERE VOMITING Has patient had a PCN reaction causing immediate rash, facial/tongue/throat swelling, SOB or lightheadedness with hypotension: no Has patient had a PCN reaction causing severe rash involving mucus membranes or skin necrosis: no Has patient had a PCN reaction that required hospitalization:  no Has patient had a PCN reaction occurring within the last 10 years: yes If all of the above answers are "NO", then may proceed with Cephalosporin use.    Amoxicillin-Pot Clavulanate Hives   Amoxicillin-Pot Clavulanate Hives   Erythromycin Other (See Comments)    Severe GI pains Other reaction(s): nausea Severe GI pains Severe stomach pain     FAMILY HISTORY: Family History  Problem Relation Age of Onset   Seizures Mother    Migraines Mother    Hypertension Father    Migraines Brother    Seizures Child    Migraines Child       Objective:  Blood pressure (!) 123/58, pulse 79, height 5\' 1"  (1.549 m), weight 123 lb 9.6 oz (56.1 kg), SpO2 100 %. General: No acute distress.  Patient appears well-groomed.   Head:  Normocephalic/atraumatic Eyes:  Fundi examined but not visualized Neck: supple, no paraspinal tenderness, full range of motion Heart:  Regular rate and rhythm Neurological Exam: alert and oriented to person, place, and time.  Speech  fluent and not dysarthric, language intact.  CN II-XII intact. Bulk and tone normal, muscle strength 5/5 throughout.  Sensation to light touch intact.  Deep tendon reflexes 2+ throughout.  Finger to nose testing intact.  Gait normal, Romberg negative.   Metta Clines, DO  CC: London Pepper, MD

## 2022-09-12 ENCOUNTER — Encounter: Payer: Self-pay | Admitting: Neurology

## 2022-09-12 ENCOUNTER — Ambulatory Visit: Payer: BC Managed Care – PPO | Admitting: Neurology

## 2022-09-12 VITALS — BP 123/58 | HR 79 | Ht 61.0 in | Wt 123.6 lb

## 2022-09-12 DIAGNOSIS — G43109 Migraine with aura, not intractable, without status migrainosus: Secondary | ICD-10-CM

## 2022-09-12 MED ORDER — REYVOW 100 MG PO TABS: 100.0000 mg | ORAL_TABLET | Freq: Every day | ORAL | 5 refills | Status: DC | PRN

## 2022-09-21 ENCOUNTER — Other Ambulatory Visit (HOSPITAL_COMMUNITY): Payer: Self-pay

## 2022-09-28 ENCOUNTER — Telehealth: Payer: Self-pay

## 2022-09-28 ENCOUNTER — Telehealth: Payer: Self-pay | Admitting: Anesthesiology

## 2022-09-28 ENCOUNTER — Other Ambulatory Visit (HOSPITAL_COMMUNITY): Payer: Self-pay

## 2022-09-28 DIAGNOSIS — G43109 Migraine with aura, not intractable, without status migrainosus: Secondary | ICD-10-CM

## 2022-09-28 NOTE — Telephone Encounter (Signed)
Per Patient the [employer] the new insurance will need a new PA.   Advised patient once approval is received we will let her know what [pharmacy her insurance approved to use so she can set up an account.

## 2022-09-28 NOTE — Telephone Encounter (Signed)
Patient requesting a call back. She now has a new insurance and would like to know where her Botox prescription needs to go to. Her new insurance is on file.

## 2022-09-28 NOTE — Telephone Encounter (Signed)
Received notification from [facility] that prior authorization is required for Reyvow 100MG  tablets.   PA submitted and APPROVED on 09-28-2022.  Key [id number] Effective: 09-28-2022 - 09-27-2023

## 2022-09-29 ENCOUNTER — Other Ambulatory Visit (HOSPITAL_COMMUNITY): Payer: Self-pay

## 2022-09-29 NOTE — Telephone Encounter (Signed)
BotoxOne benefit verification submitted.   Key [id number]

## 2022-10-04 NOTE — Telephone Encounter (Signed)
   BotoxOne benefits verification scanned to chart 

## 2022-10-11 ENCOUNTER — Other Ambulatory Visit (HOSPITAL_COMMUNITY): Payer: Self-pay

## 2022-10-11 ENCOUNTER — Telehealth: Payer: Self-pay | Admitting: Neurology

## 2022-10-11 NOTE — Telephone Encounter (Signed)
Patient called to be sure her Botox is good to go for her appointment on 10/14/22. She hasn't heard from anyone since she got new insurance effective 08/28/22, [employer].

## 2022-10-11 NOTE — Telephone Encounter (Signed)
Patient Advocate Encounter   Received notification that prior authorization for Botox 200UNIT solution is required.   PA submitted on 10/11/2022 Key [id number] Status is pending       Roland Earl, CPhT Pharmacy Patient Advocate Specialist [facility] Patient Advocate Team Direct Number: [phone]  Fax: [phone]

## 2022-10-12 NOTE — Telephone Encounter (Signed)
LMOVM for patient, PA still pending. Once approved will give patient a call.

## 2022-10-13 NOTE — Telephone Encounter (Deleted)
Patient Advocate Encounter  Received notification that the request for prior authorization for Botox 200UNIT solution has been denied due to I clicked the wrong button saying it was for cosmetic purposes instead of not for cosmetic purposes.     This determination is currently being appealed by the pharmacist.    This encounter will continue to be updated until final determination.    Roland Earl, CPhT Pharmacy Patient Advocate Specialist [facility] Patient Advocate Team Direct Number: [phone]  Fax: [phone]

## 2022-10-13 NOTE — Telephone Encounter (Signed)
Patient Advocate Encounter  Received notification that the request for prior authorization for Botox 200UNIT solution has been denied due to I clicked the wrong button saying it was for cosmetic purposes instead of not for cosmetic purposes.     This determination is currently being appealed by the pharmacist.    This encounter will continue to be updated until final determination.    Laurie Oneal, CPhT Pharmacy Patient Advocate Specialist [facility] Patient Advocate Team Direct Number: [phone]  Fax: [phone] 

## 2022-10-14 ENCOUNTER — Ambulatory Visit: Payer: BC Managed Care – PPO | Admitting: Neurology

## 2022-10-17 ENCOUNTER — Other Ambulatory Visit (HOSPITAL_COMMUNITY): Payer: Self-pay

## 2022-10-17 MED ORDER — BOTOX 200 UNITS IJ SOLR
INTRAMUSCULAR | 4 refills | Status: DC
  Filled 2022-10-17 – 2022-10-25 (×2): qty 1, 30d supply, fill #0

## 2022-10-17 NOTE — Telephone Encounter (Signed)
Approval not yet entered in the system. Will run test claim tomorrow.

## 2022-10-17 NOTE — Telephone Encounter (Signed)
Patient Advocate Encounter  Appeal Prior Authorization for Botox 200UNIT solution  has been approved.     Effective dates: 10/12/2022 through 03/29/2023  Can be filled at [facility]      Roland Earl, CPhT Pharmacy Patient Advocate Specialist [facility] Patient Advocate Team Direct Number: [phone]  Fax: [phone]

## 2022-10-17 NOTE — Addendum Note (Signed)
Addended by: Leida Lauth on: 10/17/2022 03:14 PM   Modules accepted: Orders

## 2022-10-18 ENCOUNTER — Other Ambulatory Visit (HOSPITAL_COMMUNITY): Payer: Self-pay

## 2022-10-18 NOTE — Telephone Encounter (Signed)
PA still not processing- can take up to 72 hours to be entered into the system. Will continue to watch

## 2022-10-19 ENCOUNTER — Other Ambulatory Visit (HOSPITAL_COMMUNITY): Payer: Self-pay

## 2022-10-24 ENCOUNTER — Other Ambulatory Visit (HOSPITAL_COMMUNITY): Payer: Self-pay

## 2022-10-24 NOTE — Telephone Encounter (Signed)
Called patients insurance and was notified that the [place] [employer] [place] St[employer] through the providers office was approved for the patient to fill.

## 2022-10-24 NOTE — Telephone Encounter (Signed)
Patient advised of approval and scheduled.

## 2022-10-25 ENCOUNTER — Other Ambulatory Visit (HOSPITAL_COMMUNITY): Payer: Self-pay

## 2022-11-03 ENCOUNTER — Ambulatory Visit (INDEPENDENT_AMBULATORY_CARE_PROVIDER_SITE_OTHER): Payer: BC Managed Care – PPO | Admitting: Neurology

## 2022-11-03 DIAGNOSIS — G43109 Migraine with aura, not intractable, without status migrainosus: Secondary | ICD-10-CM

## 2022-11-03 MED ORDER — ONABOTULINUMTOXINA 100 UNITS IJ SOLR
200.0000 [IU] | Freq: Once | INTRAMUSCULAR | Status: AC
  Administered 2022-11-03: 155 [IU] via INTRAMUSCULAR

## 2022-11-03 NOTE — Progress Notes (Signed)
[facility]  ° °Procedure Note Botox ° °Attending: Dr. Shamar Engelmann ° °Preoperative Diagnosis(es): Chronic migraine ° °Consent obtained from: The patient °Benefits discussed included, but were not limited to decreased muscle tightness, increased joint range of motion, and decreased pain.  Risk discussed included, but were not limited pain and discomfort, bleeding, bruising, excessive weakness, venous thrombosis, muscle atrophy and dysphagia.  Anticipated outcomes of the procedure as well as he risks and benefits of the alternatives to the procedure, and the roles and tasks of the personnel to be involved, were discussed with the patient, and the patient consents to the procedure and agrees to proceed. A copy of the patient medication guide was given to the patient which explains the blackbox warning. ° °Patients identity and treatment sites confirmed Yes.  . ° °Details of Procedure: °Skin was cleaned with alcohol. Prior to injection, the needle plunger was aspirated to make sure the needle was not within a blood vessel.  There was no blood retrieved on aspiration.   ° °Following is a summary of the muscles injected  And the amount of Botulinum toxin used: ° °Dilution °200 units of Botox was reconstituted with 4 ml of preservative free normal saline. °Time of reconstitution: At the time of the office visit (<30 minutes prior to injection)  ° °Injections  °155 total units of Botox was injected with a 30 gauge needle. ° °Injection Sites: °L occipitalis: 15 units- 3 sites  °R occiptalis: 15 units- 3 sites ° °L upper trapezius: 15 units- 3 sites °R upper trapezius: 15 units- 3 sits          °L paraspinal: 10 units- 2 sites °R paraspinal: 10 units- 2 sites ° °Face °L frontalis(2 injection sites):10 units   °R frontalis(2 injection sites):10 units         °L corrugator: 5 units   °R corrugator: 5 units           °Procerus: 5 units   °L temporalis: 20 units °R temporalis: 20 units  ° °Agent:  °200 units of botulinum Type  A (Onobotulinum Toxin type A) was reconstituted with 4 ml of preservative free normal saline.  °Time of reconstitution: At the time of the office visit (<30 minutes prior to injection)  ° ° ° Total injected (Units):  155 ° Total wasted (Units):  45 ° °Patient tolerated procedure well without complications.   °Reinjection is anticipated in 3 months. ° ° °

## 2022-11-09 ENCOUNTER — Encounter
Payer: BC Managed Care – PPO | Attending: Physical Medicine & Rehabilitation | Admitting: Physical Medicine & Rehabilitation

## 2022-11-09 ENCOUNTER — Encounter: Payer: Self-pay | Admitting: Physical Medicine & Rehabilitation

## 2022-11-09 VITALS — BP 129/88 | HR 64 | Ht 61.0 in

## 2022-11-09 DIAGNOSIS — G894 Chronic pain syndrome: Secondary | ICD-10-CM | POA: Insufficient documentation

## 2022-11-09 DIAGNOSIS — M797 Fibromyalgia: Secondary | ICD-10-CM | POA: Insufficient documentation

## 2022-11-09 MED ORDER — DICLOFENAC SODIUM 50 MG PO TBEC: 50.0000 mg | DELAYED_RELEASE_TABLET | Freq: Two times a day (BID) | ORAL | 2 refills | Status: AC | PRN

## 2022-11-09 NOTE — Progress Notes (Signed)
Subjective:    Patient ID: Laurie Oneal, female    DOB: [birth year], 50 y.o.   MRN: [id number]  HPI   Laurie Oneal is here in follow up of her chronic. She was in therapy when I last saw her. She has stayed active with stretching, exercise, yoga. She was doing very well until about a month ago. When she began to have hand/wrist pain as well as pain down her legs. She has stopped her lyrica and cymbalta as they weren't really helping her. At the same time her pain increased, she changed jobs and finds that there really isn't a place to sit or rest while she works. She wonders if this is having a role in her pain increase.   She asked about cambia or ibuprofen for pain as the cambia has helped her migraines. She has also found that ibuprofen helps a little with her body pain related to FMS. .    Pain Inventory Average Pain 4 Pain Right Now 4 My pain is intermittent, sharp, and aching  In the last 24 hours, has pain interfered with the following? General activity 1 Relation with others 0 Enjoyment of life 1 What TIME of day is your pain at its worst? daytime Sleep (in general) Fair  Pain is worse with: inactivity and standing Pain improves with:  Exercise, massage & rest Relief from Meds:  little     Family History  Problem Relation Age of Onset   Seizures Mother    Migraines Mother    Hypertension Father    Migraines Brother    Seizures Child    Migraines Child    Social History   Socioeconomic History   Marital status: Divorced    Spouse name: Not on file   Number of children: Not on file   Years of education: Not on file   Highest education level: Not on file  Occupational History   Not on file  Tobacco Use   Smoking status: Never   Smokeless tobacco: Never  Vaping Use   Vaping Use: Never used  Substance and Sexual Activity   Alcohol use: Yes    Alcohol/week: 0.0 standard drinks of alcohol    Comment: occasionally   Drug use: Yes    Types: Other-see  comments    Comment: CBD Gummies   Sexual activity: Not on file  Other Topics Concern   Not on file  Social History Narrative   Right handed   Social Determinants of Health   Financial Resource Strain: Not on file  Food Insecurity: Not on file  Transportation Needs: Not on file  Physical Activity: Not on file  Stress: Not on file  Social Connections: Not on file   Past Surgical History:  Procedure Laterality Date   CESAREAN SECTION     x3   ERCP N/A 10/16/2018   Procedure: ENDOSCOPIC RETROGRADE CHOLANGIOPANCREATOGRAPHY (ERCP);  Surgeon: Vida Rigger, MD;  Location: [facility];  Service: Endoscopy;  Laterality: N/A;   REMOVAL OF STONES  10/16/2018   Procedure: REMOVAL OF STONES;  Surgeon: Vida Rigger, MD;  Location: [facility];  Service: Endoscopy;;   SPHINCTEROTOMY  10/16/2018   Procedure: Dennison Mascot;  Surgeon: Vida Rigger, MD;  Location: [facility];  Service: Endoscopy;;   TUBAL LIGATION     Past Medical History:  Diagnosis Date   Fibromyalgia 2018   Headache(784.0)    Hypothyroidism    Seizures ([facility])    had sz as child and when they went away she started to  have migrains   Ht 5\' 1"  (1.549 m)   BMI 23.35 kg/m   Opioid Risk Score:   Fall Risk Score:  `1  Depression screen [facility] 2/9     03/30/2022    3:28 PM 12/15/2021    1:46 PM  Depression screen PHQ 2/9  Decreased Interest 2 2  Down, Depressed, Hopeless 2 1  PHQ - 2 Score 4 3  Altered sleeping  3  Tired, decreased energy  2  Change in appetite  0  Feeling bad or failure about yourself   1  Trouble concentrating  1  Moving slowly or fidgety/restless  0  Suicidal thoughts  0  PHQ-9 Score  10    Review of Systems  Constitutional:  Positive for fever.  HENT:  Positive for ear pain.   Musculoskeletal:  Positive for back pain and myalgias.       Pain in joints, both knees, ankles, elbows, wrists, outside of quads  All other systems reviewed and are negative.      Objective:   Physical  Exam  General: No acute distress HEENT: NCAT, EOMI, oral membranes moist Cards: reg rate  Chest: normal effort Abdomen: Soft, NT, ND Skin: dry, intact Extremities: no edema Psych: pleasant and appropriate  Skin: Clean and intact without signs of breakdown Neuro: Alert and oriented x 3. Normal insight and awareness. Intact Memory. Normal language and speech. Cranial nerve exam unremarkable.  Motor function appears to be intact in all 4 limbs without any signs of sensory function.  No limb ataxia is appreciated. Musculoskeletal: moving fairly well.  Posture is fair.  Gait was normal for tandem movement.  Posture appropriate.        Assessment & Plan:  Chronic pain syndrome consistent with fibromyalgia.  Certainly there is also an occupational component to this causing significant myofascial dysfunction, spasms, aggravating her low back.    Chronic migraine headaches managed by neurology     Plan: Continue with HEP. As I mentioned to her, exercise, particularly aerobic, is a hallmark of FMS treatment. I suspect her new job as well as the colder weather has reignited some of her sx.  Trial of diclofenac 50mg  bid prn for fms pain. She may use cambia for migraines. Hold off on ibuprofen.  Rest breaks as possible at work. Continue with appropriate posture/ergonomics as possible.  Migraine management per Dr. 4/9  Patient has had good results with Botox injections for her migraines    15 minutes of face to face patient care time were spent during this visit. All questions were encouraged and answered. Follow up with me in 4 months.

## 2022-11-09 NOTE — Patient Instructions (Signed)
ALWAYS FEEL FREE TO CALL OUR OFFICE WITH ANY PROBLEMS OR QUESTIONS ([phone])  **PLEASE NOTE** ALL MEDICATION REFILL REQUESTS (INCLUDING CONTROLLED SUBSTANCES) NEED TO BE MADE AT LEAST 7 DAYS PRIOR TO REFILL BEING DUE. ANY REFILL REQUESTS INSIDE THAT TIME FRAME MAY RESULT IN DELAYS IN RECEIVING YOUR PRESCRIPTION.                    

## 2022-12-02 ENCOUNTER — Other Ambulatory Visit: Payer: Self-pay | Admitting: Physical Medicine & Rehabilitation

## 2023-01-11 ENCOUNTER — Ambulatory Visit
Admission: EM | Admit: 2023-01-11 | Discharge: 2023-01-11 | Disposition: A | Discharge status: Home / Self Care | Payer: Worker's Compensation | Attending: Nurse Practitioner | Admitting: Nurse Practitioner

## 2023-01-11 DIAGNOSIS — Z203 Contact with and (suspected) exposure to rabies: Secondary | ICD-10-CM

## 2023-01-11 MED ORDER — RABIES VACCINE, PCEC IM SUSR
1.0000 mL | Freq: Once | INTRAMUSCULAR | Status: AC
  Administered 2023-01-11: 1 mL via INTRAMUSCULAR

## 2023-01-11 NOTE — ED Triage Notes (Signed)
Pt states that she came in contact with Rabies and needs rabies vaccine.

## 2023-01-11 NOTE — ED Provider Notes (Signed)
[facility]    CSN: [id number] Arrival date & time: 01/11/23  1725      History   Chief Complaint Chief Complaint  Patient presents with   Rabies exposure    HPI Laurie Oneal is a 52 y.o. female presents for evaluation of rabies vaccine update.  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  The cat began to have abnormal behavior and was tested for rabies and was found to be positive.  The cat has since been euthanized.  Patient has already been vaccinated for rabies in the past and is here for booster.  She is up-to-date on her tetanus.  She denies any puncture wounds, broken skin, scratches, etc.  She has no other concerns at this time.  HPI  Past Medical History:  Diagnosis Date   Fibromyalgia 2018   Headache(784.0)    Hypothyroidism    Seizures ([place])    had sz as child and when they went away she started to have migrains    Patient Active Problem List   Diagnosis Date Noted   Myofascial pain 03/30/2022   Chronic pain syndrome 12/15/2021   Lyme disease, unspecified 12/15/2021   Fibromyalgia 12/15/2021   GASTROENTERITIS 03/22/2010   HYPOTHYROIDISM 12/10/2009   INSOMNIA 12/01/2009   Migraine headache 06/11/2009   NECK PAIN 06/10/2009   ALLERGIC RHINITIS DUE TO OTHER ALLERGEN 12/27/2007   SEIZURE DISORDER, HX OF 12/27/2007   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   HEADACHE 08/28/2007    Past Surgical History:  Procedure Laterality Date   CESAREAN SECTION     x3   ERCP N/A 10/16/2018   Procedure: ENDOSCOPIC RETROGRADE CHOLANGIOPANCREATOGRAPHY (ERCP);  Surgeon: Clarene Essex, MD;  Location: [facility];  Service: Endoscopy;  Laterality: N/A;   REMOVAL OF STONES  10/16/2018   Procedure: REMOVAL OF STONES;  Surgeon: Clarene Essex, MD;  Location: [facility];  Service: Endoscopy;;   SPHINCTEROTOMY  10/16/2018   Procedure: Joan Mayans;  Surgeon: Clarene Essex, MD;  Location: [facility];  Service: Endoscopy;;   TUBAL LIGATION      OB History     Gravida  4   Para  3   Term  3   Preterm  0   AB  1   Living  3      SAB  1   IAB  0   Ectopic  0   Multiple  0   Live Births               Home Medications    Prior to Admission medications   Medication Sig Start Date End Date Taking? Authorizing Provider  b complex vitamins tablet Take 1 tablet by mouth daily. Takes sporatically   Yes [provider]  baclofen (LIORESAL) 10 MG tablet TAKE 1 TABLET BY MOUTH TWICE A DAY AS NEEDED 12/02/22  Yes Alger Simons T, MD  botulinum toxin Type A (BOTOX) 100 units SOLR injection See admin instructions.   Yes [provider]  botulinum toxin Type A (BOTOX) 200 units injection INJECT 155 UNITS  INTRAMUSCULARLY INTO HEAD,  NECK, AND FACE EVERY 12  WEEKS 10/17/22  Yes Jaffe, Adam R, DO  busPIRone (BUSPAR) 15 MG tablet Take 15 mg by mouth 2 (two) times daily as needed. Take 1/2 tab 02/09/21  Yes [provider]  chlorproMAZINE (THORAZINE) 25 MG tablet  04/22/20  Yes [provider]  Cholecalciferol (VITAMIN D3) 125  MCG (5000 UT) CAPS Take 1 capsule by mouth daily.    Yes [provider]  cycloSPORINE (RESTASIS) 0.05 % ophthalmic emulsion 1 drop 2 (two) times daily. 08/05/22  Yes [provider]  diclofenac (VOLTAREN) 50 MG EC tablet Take 1 tablet (50 mg total) by mouth 3 times/day as needed-between meals & bedtime. 11/09/22  Yes Meredith Staggers, MD  estradiol (ESTRACE) 1 MG tablet Take 1 tablet every day by oral route for 90 days.   Yes [provider]  ibuprofen (ADVIL,MOTRIN) 200 MG tablet Take 600 mg by mouth every 6 (six) hours as needed for headache.   Yes [provider]  Lasmiditan Succinate (REYVOW) 100 MG TABS Take 100 mg by mouth daily as needed (Maximum 1 tablet in 24 ours). 09/12/22  Yes Jaffe, Adam R, DO  Melatonin 10 MG TABS 1 tablet at bedtimd   Yes [provider]   Multiple Vitamin (MULTIVITAMIN WITH MINERALS) TABS Take 1 tablet by mouth daily.   Yes [provider]  ondansetron (ZOFRAN) 8 MG tablet Take 4 mg by mouth as needed. 01/16/19  Yes [provider]  progesterone (PROMETRIUM) 100 MG capsule Take 100 mg by mouth daily. Take 1 tablet twice a week 09/09/21  Yes [provider]  SYNTHROID 50 MCG tablet Take 50 mcg by mouth daily. 06/14/21  Yes [provider]  Diclofenac Potassium,Migraine, (CAMBIA) 50 MG PACK Take 50 tablets by mouth as needed. Patient not taking: Reported on 11/09/2022    [provider]  Eszopiclone 3 MG TABS Take 3 mg by mouth daily. Patient not taking: Reported on 11/09/2022 01/02/21   [provider]  LORazepam (ATIVAN) 1 MG tablet Take 1 mg by mouth at bedtime as needed for sleep. Patient not taking: Reported on 11/09/2022    [provider]    Family History Family History  Problem Relation Age of Onset   Seizures Mother    Migraines Mother    Hypertension Father    Migraines Brother    Seizures Child    Migraines Child     Social History Social History   Tobacco Use   Smoking status: Never   Smokeless tobacco: Never  Vaping Use   Vaping Use: Never used  Substance Use Topics   Alcohol use: Yes    Alcohol/week: 0.0 standard drinks of alcohol    Comment: occasionally   Drug use: Yes    Types: Other-see comments    Comment: CBD Gummies     Allergies   Dihydroergotamine, Gluten meal, Latex, Other, Oxycodone-acetaminophen, Percocet [oxycodone-acetaminophen], Sumatriptan, Penicillins, Amoxicillin-pot clavulanate, Amoxicillin-pot clavulanate, Erythromycin, and Wheat bran   Review of Systems Review of Systems  Constitutional:        Here for rabies booster     Physical Exam Triage Vital Signs ED Triage Vitals  Enc Vitals Group     BP 01/11/23 1752 (!) 142/86     Pulse Rate 01/11/23 1752 64     Resp 01/11/23 1752 18     Temp 01/11/23 1752  97.9 F (36.6 C)     Temp src --      SpO2 01/11/23 1752 98 %     Weight 01/11/23 1751 122 lb (55.3 kg)     Height 01/11/23 1751 5' 1"$  (1.549 m)     Head Circumference --      Peak Flow --      Pain Score 01/11/23 1751 0     Pain Loc --  Pain Edu? --      Excl. in [place]? --    No data found.  Updated Vital Signs BP (!) 142/86 (BP Location: Left Arm)   Pulse 64   Temp 97.9 F (36.6 C)   Resp 18   Ht 5' 1"$  (1.549 m)   Wt 125 lb (56.7 kg)   SpO2 98%   BMI 23.62 kg/m   Visual Acuity Right Eye Distance:   Left Eye Distance:   Bilateral Distance:    Right Eye Near:   Left Eye Near:    Bilateral Near:     Physical Exam Vitals and nursing note reviewed.  Constitutional:      Appearance: Normal appearance.  HENT:     Head: Normocephalic and atraumatic.  Eyes:     Pupils: Pupils are equal, round, and reactive to light.  Cardiovascular:     Rate and Rhythm: Normal rate.  Skin:    General: Skin is warm and dry.  Neurological:     General: No focal deficit present.     Mental Status: She is alert and oriented to person, place, and time.  Psychiatric:        Mood and Affect: Mood normal.        Behavior: Behavior normal.      UC Treatments / Results  Labs (all labs ordered are listed, but only abnormal results are displayed) Labs Reviewed - No data to display  EKG   Radiology No results found.  Procedures Procedures (including critical care time)  Medications Ordered in UC Medications  rabies vaccine (RABAVERT) injection 1 mL (has no administration in time range)    Initial Impression / Assessment and Plan / UC Course  I have reviewed the triage vital signs and the nursing notes.  Pertinent labs & imaging results that were available during my care of the patient were reviewed by me and considered in my medical decision making (see chart for details).    Patient has been vaccinated for rabies before she only requires 2 sections, 1 today and 1 in 3  days Patient given rabies vaccine today and will return in 3 days for second booster which complete her vaccine need She is to follow-up with her PCP as needed Final Clinical Impressions(s) / UC Diagnoses   Final diagnoses:  Exposure to rabies     Discharge Instructions      Return in 3 days for second booster shot   ED Prescriptions   None    PDMP not reviewed this encounter.   Melynda Ripple, NP 01/11/23 [phone]

## 2023-01-11 NOTE — Discharge Instructions (Signed)
Return in 3 days for second booster shot

## 2023-01-13 ENCOUNTER — Ambulatory Visit: Payer: BC Managed Care – PPO | Admitting: Neurology

## 2023-01-14 ENCOUNTER — Ambulatory Visit
Admission: EM | Admit: 2023-01-14 | Discharge: 2023-01-14 | Disposition: A | Discharge status: Home / Self Care | Payer: BC Managed Care – PPO | Attending: Internal Medicine | Admitting: Internal Medicine

## 2023-01-14 DIAGNOSIS — Z203 Contact with and (suspected) exposure to rabies: Secondary | ICD-10-CM | POA: Diagnosis not present

## 2023-01-14 DIAGNOSIS — Z23 Encounter for immunization: Secondary | ICD-10-CM

## 2023-01-14 MED ORDER — RABIES VACCINE, PCEC IM SUSR
1.0000 mL | Freq: Once | INTRAMUSCULAR | Status: AC
  Administered 2023-01-14: 1 mL via INTRAMUSCULAR

## 2023-01-14 NOTE — ED Triage Notes (Signed)
Pt for rabies vaccine day 3

## 2023-02-03 ENCOUNTER — Ambulatory Visit: Payer: BC Managed Care – PPO | Admitting: Neurology

## 2023-02-03 DIAGNOSIS — G43109 Migraine with aura, not intractable, without status migrainosus: Secondary | ICD-10-CM

## 2023-02-03 MED ORDER — ONABOTULINUMTOXINA 100 UNITS IJ SOLR
200.0000 [IU] | Freq: Once | INTRAMUSCULAR | Status: AC
  Administered 2023-02-03: 155 [IU] via INTRAMUSCULAR

## 2023-02-03 NOTE — Progress Notes (Signed)
[facility]  ° °Procedure Note Botox ° °Attending: Dr. Nissequogue Jon ° °Preoperative Diagnosis(es): Chronic migraine ° °Consent obtained from: The patient °Benefits discussed included, but were not limited to decreased muscle tightness, increased joint range of motion, and decreased pain.  Risk discussed included, but were not limited pain and discomfort, bleeding, bruising, excessive weakness, venous thrombosis, muscle atrophy and dysphagia.  Anticipated outcomes of the procedure as well as he risks and benefits of the alternatives to the procedure, and the roles and tasks of the personnel to be involved, were discussed with the patient, and the patient consents to the procedure and agrees to proceed. A copy of the patient medication guide was given to the patient which explains the blackbox warning. ° °Patients identity and treatment sites confirmed Yes.  . ° °Details of Procedure: °Skin was cleaned with alcohol. Prior to injection, the needle plunger was aspirated to make sure the needle was not within a blood vessel.  There was no blood retrieved on aspiration.   ° °Following is a summary of the muscles injected  And the amount of Botulinum toxin used: ° °Dilution °200 units of Botox was reconstituted with 4 ml of preservative free normal saline. °Time of reconstitution: At the time of the office visit (<30 minutes prior to injection)  ° °Injections  °155 total units of Botox was injected with a 30 gauge needle. ° °Injection Sites: °L occipitalis: 15 units- 3 sites  °R occiptalis: 15 units- 3 sites ° °L upper trapezius: 15 units- 3 sites °R upper trapezius: 15 units- 3 sits          °L paraspinal: 10 units- 2 sites °R paraspinal: 10 units- 2 sites ° °Face °L frontalis(2 injection sites):10 units   °R frontalis(2 injection sites):10 units         °L corrugator: 5 units   °R corrugator: 5 units           °Procerus: 5 units   °L temporalis: 20 units °R temporalis: 20 units  ° °Agent:  °200 units of botulinum Type  A (Onobotulinum Toxin type A) was reconstituted with 4 ml of preservative free normal saline.  °Time of reconstitution: At the time of the office visit (<30 minutes prior to injection)  ° ° ° Total injected (Units):  155 ° Total wasted (Units):  45 ° °Patient tolerated procedure well without complications.   °Reinjection is anticipated in 3 months. ° ° °

## 2023-02-09 ENCOUNTER — Inpatient Hospital Stay: Admission: RE | Admit: 2023-02-09 | Payer: Self-pay | Source: Ambulatory Visit

## 2023-02-17 ENCOUNTER — Other Ambulatory Visit: Payer: Self-pay | Admitting: Physical Medicine & Rehabilitation

## 2023-03-08 ENCOUNTER — Encounter
Payer: BC Managed Care – PPO | Attending: Physical Medicine & Rehabilitation | Admitting: Physical Medicine & Rehabilitation

## 2023-03-08 ENCOUNTER — Encounter: Payer: Self-pay | Admitting: Physical Medicine & Rehabilitation

## 2023-03-08 VITALS — BP 145/80 | HR 68 | Ht 61.0 in | Wt 124.0 lb

## 2023-03-08 DIAGNOSIS — G894 Chronic pain syndrome: Secondary | ICD-10-CM | POA: Diagnosis present

## 2023-03-08 DIAGNOSIS — M797 Fibromyalgia: Secondary | ICD-10-CM | POA: Diagnosis present

## 2023-03-08 DIAGNOSIS — M75101 Unspecified rotator cuff tear or rupture of right shoulder, not specified as traumatic: Secondary | ICD-10-CM

## 2023-03-08 MED ORDER — PREDNISONE 20 MG PO TABS
20.0000 mg | ORAL_TABLET | ORAL | 0 refills | Status: DC
Start: 2023-03-08 — End: 2023-09-29

## 2023-03-08 NOTE — Patient Instructions (Signed)
ALWAYS FEEL FREE TO CALL OUR OFFICE WITH ANY PROBLEMS OR QUESTIONS ([phone])  **PLEASE NOTE** ALL MEDICATION REFILL REQUESTS (INCLUDING CONTROLLED SUBSTANCES) NEED TO BE MADE AT LEAST 7 DAYS PRIOR TO REFILL BEING DUE. ANY REFILL REQUESTS INSIDE THAT TIME FRAME MAY RESULT IN DELAYS IN RECEIVING YOUR PRESCRIPTION.                    

## 2023-03-08 NOTE — Progress Notes (Signed)
Subjective:    Patient ID: Laurie Oneal, female    DOB: 02/12/1971, 52 y.o.   MRN: [id number]  HPI  Mrs Laurie Oneal is here in follow up of her chronic pain.   She had been doing well until she was exposed to rabies by a cat in her clinc. She received a post exposure injection on 2/17 which caused immediate pain in her right shoulder. She is having ongoing pain with movement and even at rest. She takes prednisone for eczema which helps until she cycles off the 6 day taper. The diclofenac doesn't   appear to help. When her pain is severe she struggles to lift her right arm making it difficult to put on clothing.    Pain Inventory Average Pain 2 Pain Right Now 2 My pain is sharp  In the last 24 hours, has pain interfered with the following? General activity 5 Relation with others 0 Enjoyment of life 1 What TIME of day is your pain at its worst? evening Sleep (in general) Fair  Pain is worse with: some activites Pain improves with: heat/ice Relief from Meds:  good  Family History  Problem Relation Age of Onset   Seizures Mother    Migraines Mother    Hypertension Father    Migraines Brother    Seizures Child    Migraines Child    Social History   Socioeconomic History   Marital status: Divorced    Spouse name: Not on file   Number of children: Not on file   Years of education: Not on file   Highest education level: Not on file  Occupational History   Not on file  Tobacco Use   Smoking status: Never   Smokeless tobacco: Never  Vaping Use   Vaping Use: Never used  Substance and Sexual Activity   Alcohol use: Yes    Alcohol/week: 0.0 standard drinks of alcohol    Comment: occasionally   Drug use: Yes    Types: Other-see comments    Comment: CBD Gummies   Sexual activity: Not on file  Other Topics Concern   Not on file  Social History Narrative   Right handed   Social Determinants of Health   Financial Resource Strain: Not on file  Food Insecurity: Not  on file  Transportation Needs: Not on file  Physical Activity: Not on file  Stress: Not on file  Social Connections: Not on file   Past Surgical History:  Procedure Laterality Date   CESAREAN SECTION     x3   ERCP N/A 10/16/2018   Procedure: ENDOSCOPIC RETROGRADE CHOLANGIOPANCREATOGRAPHY (ERCP);  Surgeon: Vida Rigger, MD;  Location: [facility];  Service: Endoscopy;  Laterality: N/A;   REMOVAL OF STONES  10/16/2018   Procedure: REMOVAL OF STONES;  Surgeon: Vida Rigger, MD;  Location: [facility];  Service: Endoscopy;;   SPHINCTEROTOMY  10/16/2018   Procedure: Dennison Mascot;  Surgeon: Vida Rigger, MD;  Location: [facility];  Service: Endoscopy;;   TUBAL LIGATION     Past Surgical History:  Procedure Laterality Date   CESAREAN SECTION     x3   ERCP N/A 10/16/2018   Procedure: ENDOSCOPIC RETROGRADE CHOLANGIOPANCREATOGRAPHY (ERCP);  Surgeon: Vida Rigger, MD;  Location: [facility];  Service: Endoscopy;  Laterality: N/A;   REMOVAL OF STONES  10/16/2018   Procedure: REMOVAL OF STONES;  Surgeon: Vida Rigger, MD;  Location: [facility];  Service: Endoscopy;;   SPHINCTEROTOMY  10/16/2018   Procedure: Dennison Mascot;  Surgeon: Vida Rigger, MD;  Location:  [facility];  Service: Endoscopy;;   TUBAL LIGATION     Past Medical History:  Diagnosis Date   Fibromyalgia 2018   Headache(784.0)    Hypothyroidism    Seizures    had sz as child and when they went away she started to have migrains   BP (!) 145/80   Pulse 68   Ht 5\' 1"  (1.549 m)   Wt 124 lb (56.2 kg)   SpO2 99%   BMI 23.43 kg/m   Opioid Risk Score:   Fall Risk Score:  `1  Depression screen [facility] 2/9     03/08/2023    3:49 PM 11/09/2022    3:21 PM 03/30/2022    3:28 PM 12/15/2021    1:46 PM  Depression screen PHQ 2/9  Decreased Interest 0 0 2 2  Down, Depressed, Hopeless 0 0 2 1  PHQ - 2 Score 0 0 4 3  Altered sleeping    3  Tired, decreased energy    2  Change in appetite    0  Feeling bad or failure about  yourself     1  Trouble concentrating    1  Moving slowly or fidgety/restless    0  Suicidal thoughts    0  PHQ-9 Score    10    Review of Systems  Musculoskeletal:        Right shoulder pain, right knee pain, left buttock pain  All other systems reviewed and are negative.      Objective:   Physical Exam General: No acute distress HEENT: NCAT, EOMI, oral membranes moist Cards: reg rate  Chest: normal effort Abdomen: Soft, NT, ND Skin: dry, intact Extremities: no edema Psych: pleasant and appropriate  Skin: Clean and intact without signs of breakdown Neuro: Alert and oriented x 3. Normal insight and awareness. Intact Memory. Normal language and speech. Cranial nerve exam unremarkable.  Motor function appears to be intact in all 4 limbs without any signs of sensory function.    Musculoskeletal: there is pain in the lateral/subacromial space as well as the posterior shoulder with palpation, increasing with IR/ER. Impingement test somewhat postiive. No pain with abduction or flexion. No AC jt or bicipital tendon pain. There was no appreciable weakness with ABduction of shoulder.        Assessment & Plan:  Chronic pain syndrome consistent with fibromyalgia.  Certainly there is also an occupational component to this causing significant myofascial dysfunction, spasms, aggravating her low back.    Chronic migraine headaches managed by neurology Right shoulder rotator cuff tendonitis/bursitis. I suspect her injection was given directly into the subacromial space/supraspinatus.      Plan: Prednisone taper for right shoulder. Custom instructions were provided. Refer to [employer] [facility] for RTC rom/modalities, exercises. Consider injection of right shoulder if pain persistent.  Continue diclofenac 50mg  bid prn for fms pain. She may use cambia for migraine specific pain. Good posture and rest breaks as possible at work Migraine management per Dr. Everlena Cooper  Patient has had good results  with Botox injections for her migraines   Twenty five minutes of face to face patient care time were spent during this visit. All questions were encouraged and answered.  Follow up with me in about 6 weeks for injection if needed .

## 2023-04-26 ENCOUNTER — Encounter: Payer: BC Managed Care – PPO | Admitting: Physical Medicine & Rehabilitation

## 2023-05-12 ENCOUNTER — Ambulatory Visit: Payer: BC Managed Care – PPO | Admitting: Neurology

## 2023-05-12 DIAGNOSIS — G43109 Migraine with aura, not intractable, without status migrainosus: Secondary | ICD-10-CM

## 2023-05-12 MED ORDER — ONABOTULINUMTOXINA 100 UNITS IJ SOLR
200.0000 [IU] | Freq: Once | INTRAMUSCULAR | Status: AC
Start: 2023-05-12 — End: 2023-05-12
  Administered 2023-05-12: 155 [IU] via INTRAMUSCULAR

## 2023-05-12 NOTE — Progress Notes (Signed)
[facility]  ° °Procedure Note Botox ° °Attending: Dr. Inmer Nix ° °Preoperative Diagnosis(es): Chronic migraine ° °Consent obtained from: The patient °Benefits discussed included, but were not limited to decreased muscle tightness, increased joint range of motion, and decreased pain.  Risk discussed included, but were not limited pain and discomfort, bleeding, bruising, excessive weakness, venous thrombosis, muscle atrophy and dysphagia.  Anticipated outcomes of the procedure as well as he risks and benefits of the alternatives to the procedure, and the roles and tasks of the personnel to be involved, were discussed with the patient, and the patient consents to the procedure and agrees to proceed. A copy of the patient medication guide was given to the patient which explains the blackbox warning. ° °Patients identity and treatment sites confirmed Yes.  . ° °Details of Procedure: °Skin was cleaned with alcohol. Prior to injection, the needle plunger was aspirated to make sure the needle was not within a blood vessel.  There was no blood retrieved on aspiration.   ° °Following is a summary of the muscles injected  And the amount of Botulinum toxin used: ° °Dilution °200 units of Botox was reconstituted with 4 ml of preservative free normal saline. °Time of reconstitution: At the time of the office visit (<30 minutes prior to injection)  ° °Injections  °155 total units of Botox was injected with a 30 gauge needle. ° °Injection Sites: °L occipitalis: 15 units- 3 sites  °R occiptalis: 15 units- 3 sites ° °L upper trapezius: 15 units- 3 sites °R upper trapezius: 15 units- 3 sits          °L paraspinal: 10 units- 2 sites °R paraspinal: 10 units- 2 sites ° °Face °L frontalis(2 injection sites):10 units   °R frontalis(2 injection sites):10 units         °L corrugator: 5 units   °R corrugator: 5 units           °Procerus: 5 units   °L temporalis: 20 units °R temporalis: 20 units  ° °Agent:  °200 units of botulinum Type  A (Onobotulinum Toxin type A) was reconstituted with 4 ml of preservative free normal saline.  °Time of reconstitution: At the time of the office visit (<30 minutes prior to injection)  ° ° ° Total injected (Units):  155 ° Total wasted (Units):  45 ° °Patient tolerated procedure well without complications.   °Reinjection is anticipated in 3 months. ° ° °

## 2023-07-07 ENCOUNTER — Other Ambulatory Visit (HOSPITAL_COMMUNITY): Payer: Self-pay

## 2023-08-04 ENCOUNTER — Telehealth: Payer: Self-pay | Admitting: Neurology

## 2023-08-04 NOTE — Telephone Encounter (Signed)
Patient has a schedule conflict, and is unable to make it to her Botox appt. she wants to know if she can do her botox on her F/u visit date 10/4 and reschedule to follow up later. I did check her PA is due     PA can you check on PA to see if she is due for a renewal.

## 2023-08-04 NOTE — Telephone Encounter (Signed)
Patient is returning a call to sheena ?

## 2023-08-10 ENCOUNTER — Other Ambulatory Visit (HOSPITAL_COMMUNITY): Payer: Self-pay

## 2023-08-10 NOTE — Telephone Encounter (Signed)
Looks like PA is needed for both. For the Botox, are we doing 100 or 200? Want to make sure it's submitted correctly.

## 2023-08-11 ENCOUNTER — Ambulatory Visit: Payer: BC Managed Care – PPO | Admitting: Neurology

## 2023-08-14 ENCOUNTER — Telehealth: Payer: Self-pay | Admitting: Pharmacy Technician

## 2023-08-14 DIAGNOSIS — G43109 Migraine with aura, not intractable, without status migrainosus: Secondary | ICD-10-CM

## 2023-08-14 NOTE — Telephone Encounter (Signed)
BotoxOne verification has been submitted. Benefit Verification:  [facility]  Pharmacy PA has been submitted for BOTOX 200 via CoverMyMeds. INSURANCE: [employer] DATE SUBMITTED: 9.16.24 FAX: [phone] Status is pending

## 2023-08-15 ENCOUNTER — Other Ambulatory Visit: Payer: Self-pay | Admitting: Physical Medicine & Rehabilitation

## 2023-08-15 ENCOUNTER — Other Ambulatory Visit (HOSPITAL_COMMUNITY): Payer: Self-pay

## 2023-08-15 NOTE — Telephone Encounter (Signed)
Pharmacy Patient Advocate Encounter- Botox BIV-Medical Benefit:  Buy/Bill J code: G9562  PA was submitted to [facility] and has been approved through: 9.16.24 TO 8.18.25 Authorization# [id number]  Please send prescription to Specialty Pharmacy: [facility]: [phone] Estimated Patient cost is: ?

## 2023-08-16 NOTE — Telephone Encounter (Signed)
PA request has been Submitted. New Encounter created for follow up. For additional info see Pharmacy Prior Auth telephone encounter from 08/15/2023.

## 2023-08-24 MED ORDER — BOTOX 200 UNITS IJ SOLR
INTRAMUSCULAR | 4 refills | Status: DC
Start: 2023-08-24 — End: 2024-08-06

## 2023-08-28 NOTE — Telephone Encounter (Signed)
Called [facility] and gave them AUTH NUMBER AND VALID DATES SHE IS WORKING ON EXPEDITED THE BOTOX DELIVERY

## 2023-08-29 NOTE — Telephone Encounter (Signed)
Botox order under pharmacy verification then the pharmacy will call to schedule.   Rep Lewel.    Patient advised we may or may not get Botox in time for her visit. Per Patient she had to reschedule  her appt due to a scheduling conflict.   Hopefully [facility] will expedite  the process if not will reschedule for 10/11

## 2023-08-31 NOTE — Telephone Encounter (Signed)
PA pending Medical review

## 2023-09-01 ENCOUNTER — Ambulatory Visit: Payer: BC Managed Care – PPO | Admitting: Neurology

## 2023-09-04 NOTE — Telephone Encounter (Signed)
PA still pending with [facility], Per Rep [facility] Urgent request sent.

## 2023-09-06 NOTE — Telephone Encounter (Signed)
Patient advised of delivery of 10/11.

## 2023-09-08 ENCOUNTER — Ambulatory Visit: Payer: BC Managed Care – PPO | Admitting: Neurology

## 2023-09-08 DIAGNOSIS — G43719 Chronic migraine without aura, intractable, without status migrainosus: Secondary | ICD-10-CM | POA: Diagnosis not present

## 2023-09-08 MED ORDER — ONABOTULINUMTOXINA 100 UNITS IJ SOLR
200.0000 [IU] | Freq: Once | INTRAMUSCULAR | Status: AC
Start: 2023-09-08 — End: 2023-09-08
  Administered 2023-09-08: 155 [IU] via INTRAMUSCULAR

## 2023-09-11 NOTE — Progress Notes (Signed)

## 2023-09-27 NOTE — Progress Notes (Signed)
NEUROLOGY FOLLOW UP OFFICE NOTE  Laurie Oneal [id number]  Assessment/Plan:   Migraine with aura, without status migrainosus, not intractable    Migraine prevention:  Botox  Migraine rescue:  Reyvow 100mg .  She has samples of Nurtec to try as well Limit use of pain relievers to no more than 2 days out of week to prevent risk of rebound or medication-overuse headache. Keep headache diary Follow up for next Botox.  Follow up for routine office visit in 6 months.  Subjective:  Laurie Oneal is a 52 year old right-handed female with chronic pain syndrome and hypothyroidism and history of childhood seizures who follows up for chronic migraine.   UPDATE:   One month delay in getting last Botox which increased breakthrough migraines a bit.  2 severe migraines in last 4 weeks, lasting 2 hours with Reyvow.   Rescue protocol:  Reyvow Current NSAIDS/analgesics:  none Current triptans:  Adverse reaction Current ergotamine:  Adverse reaction Current anti-emetic:  Zofran ODT 4mg  Current muscle relaxants:  baclofen Current Antihypertensive medications:  none Current Antidepressant medications:  none Current Anticonvulsant medications:  zonisamide (currently taking 100mg  - tapering off from 200mg  - concern if it may contributing to her paresthesias) ,none Current anti-CGRP:  none Current Vitamins/Herbal/Supplements:  none Current Antihistamines/Decongestants:  none Other therapy:  Botox,  Reyvow Hormone/birth control:  Synthroid Other medications:  Buspirone, Synthroid, Alprazolam, Lunesta   Caffeine:  1 cup of coffee daily.   Diet:  Does not drink enough water.  No soda.  Skips meals Exercise:  Yoga/pilates once a week.  Trying to go back to the gym. Depression:  yes; Anxiety:  yes Other pain:  Chronic generalized pain, cervical spondylosis with chronic neck pain.  Icy/burning pain in legs.   Sleep hygiene:  Poor.  On Ambien   HISTORY: Onset:  Middle school Location:   Varies -bilateral frontal, temporal, occipital regions radiating to neck and shoulders bilaterally Quality:  Throbbing, sharp, stabbing, dull Initial Intensity:  severe.  She denies new headache, thunderclap headache or severe headache that wakes her from sleep. Aura:  3 prior migraines associated with vision loss in right eye with aphasia (one time with memory recall and flashes in her vision) - lasted about 6 minutes followed by severe headache. Prodrome:  Stomach ache, foggy-headed/word-finding trouble Postdrome:  Fatigue, soreness Associated symptoms:  Nausea, vomiting, blurred vision, photophobia, phonophobia, osmophobia, eye pain.  She denies associated unilateral numbness or weakness. Initial Duration:  3 days  Initial Frequency:  On Botox, she may have headache-free months but will start getting migraines 2 weeks prior to next injection. Triggers/aggravating factors:  Movement, bright light, change in weather, stress, menses Relieving factors:  Botox has worked the best Activity:  Headaches aggravated by movement   Patient has chronic neck pain.  She sees pain management  MRI of cervical spine from 02/14/2021 personally reviewed showed tiny central disc protrusions at C2-3 and C3-4 without significant stenosis or cord impingement.  Also noted were edema and enhancement involving the left posterior paraspinal muscles posterior to the left facets at C2 through C7 presumably related to recent radiofrequency ablation.  Otherwise unremarkable.  She continues to experience shooting pain down her arm with numbness in her 5th finger of left hand and when using her hands, may have numbness and cramping involving the entire hands bilaterally.  SShe has been evaluated by rheumatology and was told she may have fibromyalgia.  For evaluation of chronic pain, she had NCV-EMG on 04/27/2021 which was  normal.    Remote CT of head from 04/02/2003 to evaluate headaches was negative.     Past NSAIDS/analgesics:   Midrin, Excedrin, Tylenol, Ultram, Vicodin, Advil, naproxen, Mobic, Toradol, Cambia, zipsor, Dilaudid, Percocet, Oxycontin Past abortive triptans:  Sumatriptan (adverse reaction), Amerge, Axert, Zomig tab Past abortive ergotamine:  Migranal Past muscle relaxants:  Flexeril, Robaxin, Skelaxin Past anti-emetic:  Thorazine, Zofran, promethazine Past antihypertensive medications:  none Past antidepressant medications:  imipramine, duloxetine, sertraline Past anticonvulsant medications: topiramate, gabapentin, pregablin Past anti-CGRP:  Ajovy (worsened headaches), Ubrelvy, Nurtec PRN (helpful) Past vitamins/Herbal/Supplements:  none Past antihistamines/decongestants:  Benadryl, Zyrtec, Claritin, Allegra, Nasonex, Flonase Other past therapies:  Trigger point injections, bilateral cervical-radiofrequency ablations.     Family history of headache:  Mother, brother, son  PAST MEDICAL HISTORY: Past Medical History:  Diagnosis Date   Fibromyalgia 2018   Headache(784.0)    Hypothyroidism    Seizures (HCC)    had sz as child and when they went away she started to have migrains    MEDICATIONS: Current Outpatient Medications on File Prior to Visit  Medication Sig Dispense Refill   b complex vitamins tablet Take 1 tablet by mouth daily. Takes sporatically     baclofen (LIORESAL) 10 MG tablet TAKE 1 TABLET BY MOUTH TWICE A DAY AS NEEDED 45 tablet 2   botulinum toxin Type A (BOTOX) 100 units SOLR injection See admin instructions.     botulinum toxin Type A (BOTOX) 200 units injection INJECT 155 UNITS  INTRAMUSCULARLY INTO HEAD,  NECK, AND FACE EVERY 12  WEEKS 1 each 4   busPIRone (BUSPAR) 15 MG tablet Take 15 mg by mouth 2 (two) times daily as needed. Take 1/2 tab     chlorproMAZINE (THORAZINE) 25 MG tablet      Cholecalciferol (VITAMIN D3) 125 MCG (5000 UT) CAPS Take 1 capsule by mouth daily.      cycloSPORINE (RESTASIS) 0.05 % ophthalmic emulsion 1 drop 2 (two) times daily.     diclofenac  (VOLTAREN) 50 MG EC tablet Take 1 tablet (50 mg total) by mouth 3 times/day as needed-between meals & bedtime. 60 tablet 2   Diclofenac Potassium,Migraine, (CAMBIA) 50 MG PACK Take 50 tablets by mouth as needed.     estradiol (ESTRACE) 1 MG tablet Take 1 tablet every day by oral route for 90 days.     Eszopiclone 3 MG TABS Take 3 mg by mouth daily.     ibuprofen (ADVIL,MOTRIN) 200 MG tablet Take 600 mg by mouth every 6 (six) hours as needed for headache.     Lasmiditan Succinate (REYVOW) 100 MG TABS Take 100 mg by mouth daily as needed (Maximum 1 tablet in 24 ours). 8 tablet 5   LORazepam (ATIVAN) 1 MG tablet Take 1 mg by mouth at bedtime as needed for sleep. (Patient not taking: Reported on 03/08/2023)     Melatonin 10 MG TABS 1 tablet at bedtimd     Multiple Vitamin (MULTIVITAMIN WITH MINERALS) TABS Take 1 tablet by mouth daily.     ondansetron (ZOFRAN) 8 MG tablet Take 4 mg by mouth as needed. (Patient not taking: Reported on 03/08/2023)     predniSONE (DELTASONE) 20 MG tablet Take 1 tablet (20 mg total) by mouth as directed. Take 1 tab 3x daily for 4 days then 1 tab 2x daily for 4 days then 1 tab once daily for 4 days, then 1/2 tab daily for 4 days and off. 26 tablet 0   progesterone (PROMETRIUM) 100 MG capsule Take 100  mg by mouth daily. Take 1 tablet twice a week     SYNTHROID 50 MCG tablet Take 50 mcg by mouth daily.     No current facility-administered medications on file prior to visit.    ALLERGIES: Allergies  Allergen Reactions   Dihydroergotamine Nausea And Vomiting and Other (See Comments)    Uncontrollable vomiting  Other reaction(s): Dizziness, Headache, vomit, Vomiting  Other Reaction(s): dizziness, headache, vomiting   Gluten Meal Rash    Other reaction(s): Abdominal Pain, skin breaks out   Latex Rash and Other (See Comments)    Other reaction(s): rash   Other Other (See Comments) and Rash   Oxycodone-Acetaminophen Nausea And Vomiting and Rash    Other reaction(s):  vomit migraine Vomiting,weakness    Percocet [Oxycodone-Acetaminophen] Nausea And Vomiting and Other (See Comments)    migraine   Sumatriptan Nausea Only, Photosensitivity and Other (See Comments)    *feels like someone pouring acid on skull*  Other reaction(s): Headache, Other, severe headaches   Penicillins Hives, Nausea And Vomiting and Other (See Comments)    **SEVERE VOMITING  Has patient had a PCN reaction causing immediate rash, facial/tongue/throat swelling, SOB or lightheadedness with hypotension: no  Has patient had a PCN reaction causing severe rash involving mucus membranes or skin necrosis: no  Has patient had a PCN reaction that required hospitalization:  no  Has patient had a PCN reaction occurring within the last 10 years: yes  If all of the above answers are "NO", then may proceed with Cephalosporin use.  Other reaction(s): hives  Has patient had a PCN reaction that required hospitalization:  no   Amoxicillin-Pot Clavulanate Hives   Amoxicillin-Pot Clavulanate Hives   Erythromycin Other (See Comments)    Severe GI pains Other reaction(s): nausea Severe GI pains Severe stomach pain    Wheat Other (See Comments)    FAMILY HISTORY: Family History  Problem Relation Age of Onset   Seizures Mother    Migraines Mother    Hypertension Father    Migraines Brother    Seizures Child    Migraines Child       Objective:  Blood pressure (!) 110/54, pulse 70, height 5' (1.524 m), weight 123 lb 6.4 oz (56 kg), SpO2 100%. General: No acute distress.  Patient appears well-groomed.    Shon Millet, DO  CC: Farris Has, MD

## 2023-09-29 ENCOUNTER — Ambulatory Visit: Payer: BC Managed Care – PPO | Admitting: Neurology

## 2023-09-29 ENCOUNTER — Encounter: Payer: Self-pay | Admitting: Neurology

## 2023-09-29 VITALS — BP 110/54 | HR 70 | Ht 60.0 in | Wt 123.4 lb

## 2023-09-29 DIAGNOSIS — G43109 Migraine with aura, not intractable, without status migrainosus: Secondary | ICD-10-CM

## 2023-09-29 MED ORDER — REYVOW 100 MG PO TABS: 100.0000 mg | ORAL_TABLET | Freq: Every day | ORAL | 11 refills | Status: AC | PRN

## 2023-09-29 NOTE — Patient Instructions (Signed)
Botox every 3 months Reyvow as needed.  May try Nurtec again if needed. Limit use of pain relievers to no more than 2 days out of week to prevent risk of rebound or medication-overuse headache. Keep headache diary Follow up 6 months.

## 2023-10-06 ENCOUNTER — Other Ambulatory Visit (HOSPITAL_COMMUNITY): Payer: Self-pay

## 2023-10-06 ENCOUNTER — Telehealth: Payer: Self-pay | Admitting: Pharmacy Technician

## 2023-10-06 NOTE — Telephone Encounter (Signed)
Pharmacy Patient Advocate Encounter   Received notification from CoverMyMeds that prior authorization for REYVOW 100MG  is required/requested.   Insurance verification completed.   The patient is insured through [facility] .   Per test claim: PA required; PA submitted to above mentioned insurance via CoverMyMeds Key/confirmation #/EOC Enedina Finner Status is pending

## 2023-10-09 ENCOUNTER — Other Ambulatory Visit (HOSPITAL_COMMUNITY): Payer: Self-pay

## 2023-10-09 NOTE — Telephone Encounter (Signed)
Pharmacy Patient Advocate Encounter  Received notification from [facility] that Prior Authorization for Reyvow 100MG  tablets has been APPROVED from 10/06/2023 to 04/03/2024. Ran test claim, Copay is $25.00. This test claim was processed through [facility]- copay amounts may vary at other pharmacies due to pharmacy/plan contracts, or as the patient moves through the different stages of their insurance plan.   PA #/Case ID/Reference #: [id number]

## 2023-11-10 ENCOUNTER — Ambulatory Visit: Payer: BC Managed Care – PPO | Admitting: Neurology

## 2023-12-15 ENCOUNTER — Ambulatory Visit (INDEPENDENT_AMBULATORY_CARE_PROVIDER_SITE_OTHER): Payer: BC Managed Care – PPO | Admitting: Neurology

## 2023-12-15 DIAGNOSIS — G43109 Migraine with aura, not intractable, without status migrainosus: Secondary | ICD-10-CM

## 2023-12-15 MED ORDER — ONABOTULINUMTOXINA 100 UNITS IJ SOLR
200.0000 [IU] | Freq: Once | INTRAMUSCULAR | Status: AC
Start: 2023-12-15 — End: 2023-12-15
  Administered 2023-12-15: 155 [IU] via INTRAMUSCULAR

## 2023-12-15 NOTE — Progress Notes (Signed)
[facility]  ° °Procedure Note Botox ° °Attending: Dr.   ° °Preoperative Diagnosis(es): Chronic migraine ° °Consent obtained from: The patient °Benefits discussed included, but were not limited to decreased muscle tightness, increased joint range of motion, and decreased pain.  Risk discussed included, but were not limited pain and discomfort, bleeding, bruising, excessive weakness, venous thrombosis, muscle atrophy and dysphagia.  Anticipated outcomes of the procedure as well as he risks and benefits of the alternatives to the procedure, and the roles and tasks of the personnel to be involved, were discussed with the patient, and the patient consents to the procedure and agrees to proceed. A copy of the patient medication guide was given to the patient which explains the blackbox warning. ° °Patients identity and treatment sites confirmed Yes.  . ° °Details of Procedure: °Skin was cleaned with alcohol. Prior to injection, the needle plunger was aspirated to make sure the needle was not within a blood vessel.  There was no blood retrieved on aspiration.   ° °Following is a summary of the muscles injected  And the amount of Botulinum toxin used: ° °Dilution °200 units of Botox was reconstituted with 4 ml of preservative free normal saline. °Time of reconstitution: At the time of the office visit (<30 minutes prior to injection)  ° °Injections  °155 total units of Botox was injected with a 30 gauge needle. ° °Injection Sites: °L occipitalis: 15 units- 3 sites  °R occiptalis: 15 units- 3 sites ° °L upper trapezius: 15 units- 3 sites °R upper trapezius: 15 units- 3 sits          °L paraspinal: 10 units- 2 sites °R paraspinal: 10 units- 2 sites ° °Face °L frontalis(2 injection sites):10 units   °R frontalis(2 injection sites):10 units         °L corrugator: 5 units   °R corrugator: 5 units           °Procerus: 5 units   °L temporalis: 20 units °R temporalis: 20 units  ° °Agent:  °200 units of botulinum Type  A (Onobotulinum Toxin type A) was reconstituted with 4 ml of preservative free normal saline.  °Time of reconstitution: At the time of the office visit (<30 minutes prior to injection)  ° ° ° Total injected (Units):  155 ° Total wasted (Units):  45 ° °Patient tolerated procedure well without complications.   °Reinjection is anticipated in 3 months. ° ° °

## 2024-02-29 ENCOUNTER — Telehealth: Payer: Self-pay

## 2024-02-29 NOTE — Telephone Encounter (Signed)
 Patient has Botox 03/21/24, D oshe need a new PA, Or botox one to check the [phone] code?

## 2024-03-05 ENCOUNTER — Telehealth: Payer: Self-pay | Admitting: Pharmacy Technician

## 2024-03-05 NOTE — Telephone Encounter (Signed)
 PA has been submitted, and telephone encounter has been created. Please see telephone encounter dated 4.8.25.

## 2024-03-05 NOTE — Telephone Encounter (Signed)
 Pharmacy Benefit PA has been submitted for Botox via Fax.  INSURANCE: [employer] KEY/EOC/FAX: [id number] Status is Pending

## 2024-03-05 NOTE — Telephone Encounter (Signed)
 Pharmacy Patient Advocate Encounter   Botox[employer] verification has been Submitted Benefit Verification #:   [id number]   Dx Code: G43.719 J-code: W0981 Procedure code: 19147

## 2024-03-07 NOTE — Telephone Encounter (Signed)
 Pharmacy Patient Advocate Encounter   BotoxOne verification has been Completed Benefit Verification #:  [id number]    Dx Code: G43.719 J-code: [id number] Procedure code: 82956

## 2024-03-13 ENCOUNTER — Other Ambulatory Visit (HOSPITAL_COMMUNITY): Payer: Self-pay

## 2024-03-21 ENCOUNTER — Ambulatory Visit: Payer: BC Managed Care – PPO | Admitting: Neurology

## 2024-03-21 DIAGNOSIS — G43109 Migraine with aura, not intractable, without status migrainosus: Secondary | ICD-10-CM | POA: Diagnosis not present

## 2024-03-21 MED ORDER — ONABOTULINUMTOXINA 100 UNITS IJ SOLR
200.0000 [IU] | Freq: Once | INTRAMUSCULAR | Status: AC
Start: 2024-03-21 — End: 2024-03-21
  Administered 2024-03-21: 155 [IU] via INTRAMUSCULAR

## 2024-03-21 NOTE — Progress Notes (Signed)
 [facility]   Procedure Note Botox   Attending: Dr. Janne Members  Preoperative Diagnosis(es): Chronic migraine  Consent obtained from: The patient Benefits discussed included, but were not limited to decreased muscle tightness, increased joint range of motion, and decreased pain.  Risk discussed included, but were not limited pain and discomfort, bleeding, bruising, excessive weakness, venous thrombosis, muscle atrophy and dysphagia.  Anticipated outcomes of the procedure as well as he risks and benefits of the alternatives to the procedure, and the roles and tasks of the personnel to be involved, were discussed with the patient, and the patient consents to the procedure and agrees to proceed. A copy of the patient medication guide was given to the patient which explains the blackbox warning.  Patients identity and treatment sites confirmed Yes.  .  Details of Procedure: Skin was cleaned with alcohol. Prior to injection, the needle plunger was aspirated to make sure the needle was not within a blood vessel.  There was no blood retrieved on aspiration.    Following is a summary of the muscles injected  And the amount of Botulinum toxin used:  Dilution 200 units of Botox  was reconstituted with 4 ml of preservative free normal saline. Time of reconstitution: At the time of the office visit (<30 minutes prior to injection)   Injections  155 total units of Botox  was injected with a 30 gauge needle.  Injection Sites: L occipitalis: 15 units- 3 sites  R occiptalis: 15 units- 3 sites  L upper trapezius: 15 units- 3 sites R upper trapezius: 15 units- 3 sits          L paraspinal: 10 units- 2 sites R paraspinal: 10 units- 2 sites  Face L frontalis(2 injection sites):10 units   R frontalis(2 injection sites):10 units         L corrugator: 5 units   R corrugator: 5 units           Procerus: 5 units   L temporalis: 20 units R temporalis: 20 units   Agent:  200 units of botulinum Type  A (Onobotulinum Toxin type A) was reconstituted with 4 ml of preservative free normal saline.  Time of reconstitution: At the time of the office visit (<30 minutes prior to injection)     Total injected (Units):  155  Total wasted (Units):  45  Patient tolerated procedure well without complications.

## 2024-03-26 ENCOUNTER — Telehealth: Payer: Self-pay | Admitting: Pharmacy Technician

## 2024-03-26 ENCOUNTER — Other Ambulatory Visit (HOSPITAL_COMMUNITY): Payer: Self-pay

## 2024-03-26 NOTE — Telephone Encounter (Signed)
 Pharmacy Patient Advocate Encounter   Received notification from CoverMyMeds that prior authorization for REYVOW  100MG  is required/requested.   Insurance verification completed.   The patient is insured through [facility] .   Per test claim: PA required; PA submitted to above mentioned insurance via CoverMyMeds Key/confirmation #/EOC [id number] Status is pending

## 2024-03-26 NOTE — Telephone Encounter (Signed)
 Pharmacy Patient Advocate Encounter  Received notification from [facility] that Prior Authorization for REYVOW  100MG  has been CANCELLED due to

## 2024-03-29 ENCOUNTER — Ambulatory Visit: Payer: BC Managed Care – PPO | Admitting: Neurology

## 2024-04-11 ENCOUNTER — Ambulatory Visit: Payer: BC Managed Care – PPO | Admitting: Neurology

## 2024-04-25 NOTE — Progress Notes (Unsigned)
 NEUROLOGY FOLLOW UP OFFICE NOTE  Laurie Oneal [id number]  Assessment/Plan:   Migraine with aura, without status migrainosus, not intractable    Migraine prevention:  Botox  Migraine rescue:  She responds better to Nurtec than Reyvow .  Will resend prescription for Nurtec to get prior authorization.  She has already tried multiple triptans as mentioned below, as well as Ubrelvy and Reyvow . Limit use of pain relievers to no more than 2 days out of week to prevent risk of rebound or medication-overuse headache. Keep headache diary Follow up for next Botox .  Follow up for routine office visit in one year  Subjective:  Laurie Oneal is a 53 year old right-handed female with chronic pain syndrome and hypothyroidism and history of childhood seizures who follows up for chronic migraine.   UPDATE: Overall doing well Intensity:  6-7/10 Duration:  2 hours with leftover Nurtec and then dull postdrome headache for rest of day. Hasn't been using the Reyvow  because last time she took Reyvow , it made her sick - dizzy, lethargic). Frequency:  1 every few months.  May have tension type headache 2 to 3 days a month.   Rescue protocol:  Reyvow  Current NSAIDS/analgesics:  none Current triptans:  Adverse reaction Current ergotamine:  Adverse reaction Current anti-emetic:  Zofran  ODT 4mg  Current muscle relaxants:  baclofen  Current Antihypertensive medications:  none Current Antidepressant medications:  none Current Anticonvulsant medications:  zonisamide (currently taking 100mg  - tapering off from 200mg  - concern if it may contributing to her paresthesias) ,none Current anti-CGRP:  none Current Vitamins/Herbal/Supplements:  none Current Antihistamines/Decongestants:  none Other therapy:  Botox ,  Reyvow  Hormone/birth control:  Synthroid Other medications:  Buspirone, Synthroid, Alprazolam, Lunesta   Caffeine:  1 cup of coffee daily.   Diet:  Does not drink enough water.  No soda.   Skips meals Exercise:  Yoga/pilates once a week.  Trying to go back to the gym. Depression:  yes; Anxiety:  yes Other pain:  Chronic generalized pain, cervical spondylosis with chronic neck pain.  Icy/burning pain in legs.   Sleep hygiene:  Poor.  On Ambien   HISTORY: Onset:  Middle school Location:  Varies -bilateral frontal, temporal, occipital regions radiating to neck and shoulders bilaterally Quality:  Throbbing, sharp, stabbing, dull Initial Intensity:  severe.  She denies new headache, thunderclap headache or severe headache that wakes her from sleep. Aura:  3 prior migraines associated with vision loss in right eye with aphasia (one time with memory recall and flashes in her vision) - lasted about 6 minutes followed by severe headache. Prodrome:  Stomach ache, foggy-headed/word-finding trouble Postdrome:  Fatigue, soreness Associated symptoms:  Nausea, vomiting, blurred vision, photophobia, phonophobia, osmophobia, eye pain.  She denies associated unilateral numbness or weakness. Initial Duration:  3 days  Initial Frequency:  On Botox , she may have headache-free months but will start getting migraines 2 weeks prior to next injection. Triggers/aggravating factors:  Movement, bright light, change in weather, stress, menses Relieving factors:  Botox  has worked the best Activity:  Headaches aggravated by movement   Patient has chronic neck pain.  She sees pain management  MRI of cervical spine from 02/14/2021 personally reviewed showed tiny central disc protrusions at C2-3 and C3-4 without significant stenosis or cord impingement.  Also noted were edema and enhancement involving the left posterior paraspinal muscles posterior to the left facets at C2 through C7 presumably related to recent radiofrequency ablation.  Otherwise unremarkable.  She continues to experience shooting pain down her arm with  numbness in her 5th finger of left hand and when using her hands, may have numbness and  cramping involving the entire hands bilaterally.  SShe has been evaluated by rheumatology and was told she may have fibromyalgia.  For evaluation of chronic pain, she had NCV-EMG on 04/27/2021 which was normal.    Remote CT of head from 04/02/2003 to evaluate headaches was negative.     Past NSAIDS/analgesics:  Midrin, Excedrin, Tylenol , Ultram, Vicodin, Advil, naproxen, Mobic, Toradol , Cambia, zipsor, Dilaudid, Percocet, Oxycontin Past abortive triptans:  Sumatriptan (adverse reaction), Amerge, Axert, Zomig tab Past abortive ergotamine:  Migranal Past muscle relaxants:  Flexeril, Robaxin, Skelaxin Past anti-emetic:  Thorazine, Zofran , promethazine Past antihypertensive medications:  none Past antidepressant medications:  imipramine, duloxetine, sertraline Past anticonvulsant medications: topiramate, gabapentin , pregablin Past anti-CGRP:  Ajovy (worsened headaches), Ubrelvy, Nurtec PRN (helpful) Past vitamins/Herbal/Supplements:  none Past antihistamines/decongestants:  Benadryl , Zyrtec, Claritin, Allegra, Nasonex, Flonase Other past therapies:  Trigger point injections, bilateral cervical-radiofrequency ablations.     Family history of headache:  Mother, brother, son  PAST MEDICAL HISTORY: Past Medical History:  Diagnosis Date   Fibromyalgia 2018   Headache(784.0)    Hypothyroidism    Seizures (HCC)    had sz as child and when they went away she started to have migrains    MEDICATIONS: Current Outpatient Medications on File Prior to Visit  Medication Sig Dispense Refill   b complex vitamins tablet Take 1 tablet by mouth daily. Takes sporatically     baclofen  (LIORESAL ) 10 MG tablet TAKE 1 TABLET BY MOUTH TWICE A DAY AS NEEDED (Patient taking differently: Take 10 mg by mouth daily at 2 PM.) 45 tablet 2   botulinum toxin Type A  (BOTOX ) 100 units SOLR injection See admin instructions.     botulinum toxin Type A  (BOTOX ) 200 units injection INJECT 155 UNITS  INTRAMUSCULARLY INTO HEAD,   NECK, AND FACE EVERY 12  WEEKS 1 each 4   chlorproMAZINE (THORAZINE) 25 MG tablet      Cholecalciferol (VITAMIN D3) 125 MCG (5000 UT) CAPS Take 1 capsule by mouth daily.      cycloSPORINE (RESTASIS) 0.05 % ophthalmic emulsion 1 drop 2 (two) times daily.     diclofenac  (VOLTAREN ) 50 MG EC tablet Take 1 tablet (50 mg total) by mouth 3 times/day as needed-between meals & bedtime. 60 tablet 2   Diclofenac  Potassium,Migraine, (CAMBIA) 50 MG PACK Take 50 tablets by mouth as needed.     estradiol (ESTRACE) 1 MG tablet Take 1 tablet every day by oral route for 90 days.     Eszopiclone 3 MG TABS Take 3 mg by mouth daily.     ibuprofen (ADVIL,MOTRIN) 200 MG tablet Take 600 mg by mouth every 6 (six) hours as needed for headache.     Lasmiditan  Succinate (REYVOW ) 100 MG TABS Take 1 tablet (100 mg total) by mouth daily as needed (Maximum 1 tablet in 24 ours). 8 tablet 11   Melatonin 10 MG TABS 1 tablet at bedtimd     Multiple Vitamin (MULTIVITAMIN WITH MINERALS) TABS Take 1 tablet by mouth daily.     ondansetron  (ZOFRAN ) 8 MG tablet Take 4 mg by mouth as needed.     progesterone (PROMETRIUM) 100 MG capsule Take 100 mg by mouth daily. Take 1 tablet twice a week     SYNTHROID 50 MCG tablet Take 50 mcg by mouth daily.     No current facility-administered medications on file prior to visit.    ALLERGIES: Allergies  Allergen Reactions   Dihydroergotamine Nausea And Vomiting and Other (See Comments)    Uncontrollable vomiting  Other reaction(s): Dizziness, Headache, vomit, Vomiting  Other Reaction(s): dizziness, headache, vomiting   Gluten Meal Rash    Other reaction(s): Abdominal Pain, skin breaks out   Latex Rash and Other (See Comments)    Other reaction(s): rash   Other Other (See Comments) and Rash   Oxycodone-Acetaminophen  Nausea And Vomiting and Rash    Other reaction(s): vomit migraine Vomiting,weakness    Percocet [Oxycodone-Acetaminophen ] Nausea And Vomiting and Other (See Comments)     migraine   Sumatriptan Nausea Only, Photosensitivity and Other (See Comments)    *feels like someone pouring acid on skull*  Other reaction(s): Headache, Other, severe headaches   Penicillins Hives, Nausea And Vomiting and Other (See Comments)    **SEVERE VOMITING  Has patient had a PCN reaction causing immediate rash, facial/tongue/throat swelling, SOB or lightheadedness with hypotension: no  Has patient had a PCN reaction causing severe rash involving mucus membranes or skin necrosis: no  Has patient had a PCN reaction that required hospitalization:  no  Has patient had a PCN reaction occurring within the last 10 years: yes  If all of the above answers are "NO", then may proceed with Cephalosporin use.  Other reaction(s): hives  Has patient had a PCN reaction that required hospitalization:  no   Amoxicillin-Pot Clavulanate Hives   Amoxicillin-Pot Clavulanate Hives   Erythromycin Other (See Comments)    Severe GI pains Other reaction(s): nausea Severe GI pains Severe stomach pain    Wheat Other (See Comments)    FAMILY HISTORY: Family History  Problem Relation Age of Onset   Seizures Mother    Migraines Mother    Hypertension Father    Migraines Brother    Seizures Child    Migraines Child       Objective:  Blood pressure 100/70, pulse 60, height 5\' 1"  (1.549 m), weight 123 lb (55.8 kg), SpO2 96%.  General: No acute distress.  Patient appears well-groomed.   Head:  Normocephalic/atraumatic Neck:  Supple.  No paraspinal tenderness.  Full range of motion. Heart:  Regular rate and rhythm. Neuro:  Alert and oriented.  Speech fluent and not dysarthric.  Language intact.  CN II-XII intact.  Bulk and tone normal.  Muscle strength 5/5 throughout.  Sensation to light touch intact.  Deep tendon reflexes 2+ throughout, toes downgoing.  Gait normal.  Romberg negative.  Janne Members, DO  CC: Ronna Coho, MD

## 2024-04-26 ENCOUNTER — Ambulatory Visit: Payer: BC Managed Care – PPO | Admitting: Neurology

## 2024-04-26 ENCOUNTER — Encounter: Payer: Self-pay | Admitting: Neurology

## 2024-04-26 VITALS — BP 100/70 | HR 60 | Ht 61.0 in | Wt 123.0 lb

## 2024-04-26 DIAGNOSIS — G43109 Migraine with aura, not intractable, without status migrainosus: Secondary | ICD-10-CM

## 2024-04-26 MED ORDER — NURTEC 75 MG PO TBDP: 1.0000 | ORAL_TABLET | Freq: Every day | ORAL | 11 refills | Status: AC | PRN

## 2024-06-10 ENCOUNTER — Other Ambulatory Visit (HOSPITAL_COMMUNITY): Payer: Self-pay

## 2024-06-10 NOTE — Telephone Encounter (Signed)
 Called and spoke with insurance. Pt current PA is active until 8.18.25. (see encounter 9.16.24) Can't submit a new PA until 7.18.25.

## 2024-06-21 ENCOUNTER — Ambulatory Visit: Admitting: Neurology

## 2024-06-21 DIAGNOSIS — G43109 Migraine with aura, not intractable, without status migrainosus: Secondary | ICD-10-CM

## 2024-06-21 MED ORDER — ONABOTULINUMTOXINA 100 UNITS IJ SOLR
200.0000 [IU] | Freq: Once | INTRAMUSCULAR | Status: AC
  Administered 2024-06-21: 155 [IU] via INTRAMUSCULAR

## 2024-06-21 MED ORDER — NURTEC 75 MG PO TBDP: ORAL_TABLET | ORAL | Status: AC

## 2024-06-21 NOTE — Progress Notes (Signed)

## 2024-07-10 ENCOUNTER — Other Ambulatory Visit (HOSPITAL_COMMUNITY): Payer: Self-pay

## 2024-07-10 ENCOUNTER — Telehealth: Payer: Self-pay

## 2024-07-10 NOTE — Telephone Encounter (Signed)
PA needed for Botox 200 units 

## 2024-07-15 NOTE — Telephone Encounter (Signed)
 [facility] following up on a Auth that was faxed over

## 2024-07-16 ENCOUNTER — Telehealth: Payer: Self-pay | Admitting: Pharmacy Technician

## 2024-07-16 DIAGNOSIS — G43109 Migraine with aura, not intractable, without status migrainosus: Secondary | ICD-10-CM

## 2024-07-16 NOTE — Telephone Encounter (Signed)
 Pharmacy Patient Advocate Encounter   Received notification from Pt Calls Messages that prior authorization for BOTOX  200 is required/requested.   Insurance verification completed.   The patient is insured through [facility] .   Per test claim: PA required; PA submitted to above mentioned insurance via Fax Key/confirmation #/EOC [id number] Status is pending

## 2024-07-31 ENCOUNTER — Other Ambulatory Visit (HOSPITAL_COMMUNITY): Payer: Self-pay

## 2024-07-31 NOTE — Telephone Encounter (Signed)
 PA has been submitted, and telephone encounter has been created. Please see telephone encounter dated 8.18.25.

## 2024-08-05 NOTE — Telephone Encounter (Signed)
 Pharmacy Patient Advocate Encounter  Received notification from [facility] that Prior Authorization for Botox  200Units has been APPROVED from 07-31-2024 to 07-02-2025   PA #/Case ID/Reference #: [id number]

## 2024-08-06 ENCOUNTER — Other Ambulatory Visit (HOSPITAL_COMMUNITY): Payer: Self-pay

## 2024-08-06 MED ORDER — BOTOX 200 UNITS IJ SOLR: INTRAMUSCULAR | 4 refills | Status: AC

## 2024-08-06 NOTE — Telephone Encounter (Signed)
New script sent to [facility].

## 2024-08-06 NOTE — Telephone Encounter (Signed)
 There are no restrictions noted

## 2024-09-18 ENCOUNTER — Encounter: Payer: Self-pay | Admitting: Neurology

## 2024-09-20 ENCOUNTER — Ambulatory Visit: Admitting: Neurology

## 2024-09-20 DIAGNOSIS — G43109 Migraine with aura, not intractable, without status migrainosus: Secondary | ICD-10-CM

## 2024-09-20 MED ORDER — ONABOTULINUMTOXINA 100 UNITS IJ SOLR
200.0000 [IU] | Freq: Once | INTRAMUSCULAR | Status: AC
  Administered 2024-09-20: 155 [IU] via INTRAMUSCULAR

## 2024-09-20 NOTE — Progress Notes (Signed)

## 2024-12-20 ENCOUNTER — Ambulatory Visit: Admitting: Neurology

## 2024-12-20 DIAGNOSIS — G43109 Migraine with aura, not intractable, without status migrainosus: Secondary | ICD-10-CM

## 2024-12-20 MED ORDER — ONABOTULINUMTOXINA 100 UNITS IJ SOLR
200.0000 [IU] | Freq: Once | INTRAMUSCULAR | Status: AC
  Administered 2024-12-20: 155 [IU] via INTRAMUSCULAR

## 2024-12-20 NOTE — Progress Notes (Signed)

## 2025-03-21 ENCOUNTER — Ambulatory Visit: Payer: Self-pay | Admitting: Neurology

## 2025-04-25 ENCOUNTER — Ambulatory Visit: Admitting: Neurology

## 2025-05-01 ENCOUNTER — Ambulatory Visit: Admitting: Neurology
